# Patient Record
Sex: Female | Born: 1960 | Race: Black or African American | Hispanic: No | Marital: Single | Smoking: Never smoker
Health system: Southern US, Community
[De-identification: ages and names within clinical notes are randomized; demographics above are authoritative.]

## PROBLEM LIST (undated history)

## (undated) DIAGNOSIS — E669 Obesity, unspecified: Secondary | ICD-10-CM

## (undated) DIAGNOSIS — I10 Essential (primary) hypertension: Secondary | ICD-10-CM

## (undated) DIAGNOSIS — K219 Gastro-esophageal reflux disease without esophagitis: Secondary | ICD-10-CM

## (undated) DIAGNOSIS — D219 Benign neoplasm of connective and other soft tissue, unspecified: Secondary | ICD-10-CM

## (undated) DIAGNOSIS — N939 Abnormal uterine and vaginal bleeding, unspecified: Secondary | ICD-10-CM

## (undated) HISTORY — DX: Abnormal uterine and vaginal bleeding, unspecified: N93.9

## (undated) HISTORY — DX: Gastro-esophageal reflux disease without esophagitis: K21.9

## (undated) HISTORY — DX: Benign neoplasm of connective and other soft tissue, unspecified: D21.9

## (undated) HISTORY — DX: Essential (primary) hypertension: I10

## (undated) HISTORY — DX: Obesity, unspecified: E66.9

---

## 1992-05-16 HISTORY — PX: TUBAL LIGATION: SHX77

## 1996-05-16 HISTORY — PX: BREAST BIOPSY: SHX20

## 1997-10-17 ENCOUNTER — Ambulatory Visit (HOSPITAL_COMMUNITY): Admission: RE | Admit: 1997-10-17 | Discharge: 1997-10-17 | Payer: Self-pay | Admitting: Obstetrics & Gynecology

## 1998-06-03 ENCOUNTER — Ambulatory Visit (HOSPITAL_COMMUNITY): Admission: RE | Admit: 1998-06-03 | Discharge: 1998-06-03 | Payer: Self-pay

## 1999-12-09 ENCOUNTER — Encounter: Admission: RE | Admit: 1999-12-09 | Discharge: 1999-12-09 | Payer: Self-pay | Admitting: Obstetrics

## 1999-12-24 ENCOUNTER — Ambulatory Visit (HOSPITAL_COMMUNITY): Admission: RE | Admit: 1999-12-24 | Discharge: 1999-12-24 | Payer: Self-pay

## 2000-06-16 ENCOUNTER — Other Ambulatory Visit: Admission: RE | Admit: 2000-06-16 | Discharge: 2000-06-16 | Payer: Self-pay | Admitting: Gynecology

## 2001-07-04 ENCOUNTER — Other Ambulatory Visit: Admission: RE | Admit: 2001-07-04 | Discharge: 2001-07-04 | Payer: Self-pay | Admitting: Gynecology

## 2001-11-22 ENCOUNTER — Encounter: Payer: Self-pay | Admitting: Gynecology

## 2001-11-22 ENCOUNTER — Ambulatory Visit (HOSPITAL_COMMUNITY): Admission: RE | Admit: 2001-11-22 | Discharge: 2001-11-22 | Payer: Self-pay | Admitting: Gynecology

## 2001-12-11 ENCOUNTER — Encounter: Payer: Self-pay | Admitting: Internal Medicine

## 2001-12-11 ENCOUNTER — Ambulatory Visit (HOSPITAL_COMMUNITY): Admission: RE | Admit: 2001-12-11 | Discharge: 2001-12-11 | Payer: Self-pay | Admitting: Internal Medicine

## 2002-04-03 ENCOUNTER — Encounter: Admission: RE | Admit: 2002-04-03 | Discharge: 2002-04-03 | Payer: Self-pay | Admitting: Internal Medicine

## 2002-04-03 ENCOUNTER — Encounter: Payer: Self-pay | Admitting: Internal Medicine

## 2002-07-31 ENCOUNTER — Other Ambulatory Visit: Admission: RE | Admit: 2002-07-31 | Discharge: 2002-07-31 | Payer: Self-pay | Admitting: Gynecology

## 2002-09-05 ENCOUNTER — Encounter: Admission: RE | Admit: 2002-09-05 | Discharge: 2002-09-05 | Payer: Self-pay | Admitting: Internal Medicine

## 2002-09-05 ENCOUNTER — Encounter: Payer: Self-pay | Admitting: Internal Medicine

## 2002-11-27 ENCOUNTER — Encounter: Payer: Self-pay | Admitting: Gynecology

## 2002-11-27 ENCOUNTER — Ambulatory Visit (HOSPITAL_COMMUNITY): Admission: RE | Admit: 2002-11-27 | Discharge: 2002-11-27 | Payer: Self-pay | Admitting: Gynecology

## 2003-08-21 ENCOUNTER — Other Ambulatory Visit: Admission: RE | Admit: 2003-08-21 | Discharge: 2003-08-21 | Payer: Self-pay | Admitting: Gynecology

## 2003-11-28 ENCOUNTER — Ambulatory Visit (HOSPITAL_COMMUNITY): Admission: RE | Admit: 2003-11-28 | Discharge: 2003-11-28 | Payer: Self-pay | Admitting: Gynecology

## 2004-08-26 ENCOUNTER — Other Ambulatory Visit: Admission: RE | Admit: 2004-08-26 | Discharge: 2004-08-26 | Payer: Self-pay | Admitting: Gynecology

## 2004-11-29 ENCOUNTER — Ambulatory Visit (HOSPITAL_COMMUNITY): Admission: RE | Admit: 2004-11-29 | Discharge: 2004-11-29 | Payer: Self-pay | Admitting: Gynecology

## 2005-09-05 ENCOUNTER — Other Ambulatory Visit: Admission: RE | Admit: 2005-09-05 | Discharge: 2005-09-05 | Payer: Self-pay | Admitting: Gynecology

## 2005-11-07 ENCOUNTER — Inpatient Hospital Stay (HOSPITAL_COMMUNITY): Admission: RE | Admit: 2005-11-07 | Discharge: 2005-11-09 | Payer: Self-pay | Admitting: Gynecology

## 2005-11-07 ENCOUNTER — Encounter (INDEPENDENT_AMBULATORY_CARE_PROVIDER_SITE_OTHER): Payer: Self-pay | Admitting: Specialist

## 2005-11-07 HISTORY — PX: ABDOMINAL HYSTERECTOMY: SHX81

## 2005-12-01 ENCOUNTER — Ambulatory Visit (HOSPITAL_COMMUNITY): Admission: RE | Admit: 2005-12-01 | Discharge: 2005-12-01 | Payer: Self-pay | Admitting: Gynecology

## 2006-10-03 ENCOUNTER — Other Ambulatory Visit: Admission: RE | Admit: 2006-10-03 | Discharge: 2006-10-03 | Payer: Self-pay | Admitting: Gynecology

## 2006-12-19 ENCOUNTER — Ambulatory Visit (HOSPITAL_COMMUNITY): Admission: RE | Admit: 2006-12-19 | Discharge: 2006-12-19 | Payer: Self-pay | Admitting: Gynecology

## 2007-10-04 ENCOUNTER — Other Ambulatory Visit: Admission: RE | Admit: 2007-10-04 | Discharge: 2007-10-04 | Payer: Self-pay | Admitting: Gynecology

## 2007-12-20 ENCOUNTER — Ambulatory Visit (HOSPITAL_COMMUNITY): Admission: RE | Admit: 2007-12-20 | Discharge: 2007-12-20 | Payer: Self-pay | Admitting: Gynecology

## 2008-10-07 ENCOUNTER — Ambulatory Visit: Payer: Self-pay | Admitting: Gynecology

## 2008-10-07 ENCOUNTER — Other Ambulatory Visit: Admission: RE | Admit: 2008-10-07 | Discharge: 2008-10-07 | Payer: Self-pay | Admitting: Gynecology

## 2008-10-07 ENCOUNTER — Encounter: Payer: Self-pay | Admitting: Gynecology

## 2008-12-23 ENCOUNTER — Ambulatory Visit (HOSPITAL_COMMUNITY): Admission: RE | Admit: 2008-12-23 | Discharge: 2008-12-23 | Payer: Self-pay | Admitting: Gynecology

## 2009-10-08 ENCOUNTER — Ambulatory Visit: Payer: Self-pay | Admitting: Gynecology

## 2009-10-08 ENCOUNTER — Other Ambulatory Visit: Admission: RE | Admit: 2009-10-08 | Discharge: 2009-10-08 | Payer: Self-pay | Admitting: Gynecology

## 2009-11-30 ENCOUNTER — Ambulatory Visit: Payer: Self-pay | Admitting: Gynecology

## 2009-12-24 ENCOUNTER — Ambulatory Visit (HOSPITAL_COMMUNITY): Admission: RE | Admit: 2009-12-24 | Discharge: 2009-12-24 | Payer: Self-pay | Admitting: Gynecology

## 2010-10-01 NOTE — Discharge Summary (Signed)
NAME:  Faith Delgado, Faith Delgado                 ACCOUNT NO.:  [id number]   MEDICAL RECORD NO.:  [id number]          PATIENT TYPE:  INP   LOCATION:  9316                          FACILITY:  [facility]   PHYSICIAN:  Juan H. Lily Peer, M.D.DATE OF BIRTH:  1961-02-06   DATE OF ADMISSION:  11/07/2005  DATE OF DISCHARGE:  11/09/2005                                 DISCHARGE SUMMARY   TOTAL DAYS HOSPITALIZED:  Two.   HISTORY:  The patient is a 50 year old gravida 2, para 2 with symptomatic  leiomyomatous uteri who underwent total abdominal hysterectomy the morning  of November 07, 2005.  She had blood loss of 300 mL and had received cefoxitin 2  g IV for prophylaxis.  She did well postoperatively.  Her Foley catheter was  removed within 24 hours.  She was up and ambulating, tolerated liquid diet  and her diet was advanced to a regular diet.  Her postoperative day #1  hemoglobin was 9.9.  Urine output had been adequate.  On her postoperative  day, patient had been up and ambulating, tolerating a regular diet well and  passed flatus, had showered and was ready to be discharged home.  Her vital  signs were stable and she was afebrile.   FINAL DIAGNOSIS:  Symptomatic leiomyomatous uteri.   PROCEDURE PERFORMED:  Total abdominal hysterectomy.   FINAL DISPOSITION AND FOLLOW-UP:  Patient was up and ambulating, tolerated  regular diet well and had showered and was ready to be discharged home on  her second postoperative day.  She will returned back to the office in a few  days to have her staples removed.  Discharge instructions were provided.  She was given a prescription of Darvocet to take one p.o. q.4-6h. p.r.n.  pain and Motrin 800 mg t.i.d. p.r.n.  Pathology report pending at the time  of this dictation.      Juan H. Lily Peer, M.D.  Electronically Signed     JHF/MEDQ  D:  11/09/2005  T:  11/09/2005  Job:  [id number]

## 2010-10-01 NOTE — H&P (Signed)
NAME:  Delgado, Faith              ACCOUNT NO.:  [id number]   MEDICAL RECORD NO.:  [id number]          PATIENT TYPE:  INP   LOCATION:                                [facility]ILITY[facility]  [facility]   PHYSICIAN:  Juan H. Lily Peer, M.D.     DATE OF BIRTH:   DATE OF ADMISSION:  11/07/2005  DATE OF DISCHARGE:                                HISTORY & PHYSICAL   CHIEF COMPLAINT:  Symptomatic leiomyomatous uteri.   HISTORY:  The patient is a 50 year old gravida 2, para 2, who was seen for  annual gynecological examination on April 23rd and during her examination  her uterus felt to be enlarged.  She subsequently underwent an ultrasound  which demonstrated the following.  The uterus measured 10 x 6 x 6 cm, had  intramural myomas and a subserosal myoma measuring 44 x 28 mm and a right  pedunculated myoma measuring 70 x 52 x 56 mm.  The right ovary had small  dominant follicles but the left adnexa was not able to be visualized.  The  patient had been complaining of dysmenorrhea, menorrhagia and dyspareunia.  She had a Pap smear done at time of her annual exam on April 23rd which was  normal.  She also had an endometrial biopsy done on June 8th which  demonstrated proliferative endometrium with no evidence of hyperplasia or  malignancy identified.  The patient's most recent hemoglobin and hematocrit  demonstrated a hemoglobin of 11.5, hematocrit 35.6, platelet count 257,000.  The patient is scheduled for total abdominal hysterectomy with ovarian  conservation.   PAST MEDICAL HISTORY:  1.  Patient DENIES ANY ALLERGY.  2.  History of fibroids.  3.  Menorrhagia.   She is on multivitamins and has had a previous tubal sterilization procedure  in 1994.   FAMILY HISTORY:  Significant for hypertension and colon cancer in  grandparents.   PHYSICAL EXAMINATION:  Patient weighs 202 pounds, height 5 feet 5 inches  tall, blood pressure 130/80.  HEENT:  Unremarkable.  NECK:  Supple.  Trachea midline.  No carotid  bruits, no thyromegaly.  LUNGS:  Clear to auscultation without any rhonchi or wheezes.  HEART:  Regular rate and rhythm, no murmurs or gallop.  BREAST EXAM:  Was unremarkable.  ABDOMEN:  Soft and nontender.  No rebound or guarding.  PELVIC EXAM:  Bartholin and urethra __________ within normal limits.  Vagina  and cervix no lesions or discharge.  Uterus irregular shape, enlarged,  especially deviated to the right.  Adnexa with a fullness in that area,  suspicious for the fibroid seen on ultrasound.  RECTAL EXAM:  Unremarkable.   ASSESSMENT:  A 50 year old gravida 2, para 2, with previous tubal  sterilization procedure with symptomatic leiomyomata uteri consisting of  iron deficiency anemia, dysmenorrhea, menorrhagia, dyspareunia.  Normal  endometrial biopsy, normal Pap smear, normal mammogram done recently.  The  patient's endometrial biopsy was benign as well.  The patient scheduled to  undergo a total abdominal hysterectomy at [facility] on Monday, June  25th, at 7:30 a.m.  The risks, benefits and pros and cons of the  operation  were discussed in detail with the patient to include infection.  She will  receive prophylactic antibiotics.  The risks were deviated thrombosis, she  will have PSA stockings to prevent such event and also in the event of  uncontrolled hemorrhage if patient were to need blood or blood products.  She is fully aware of potential risk associated with blood transfusion such  as anaphylactic reaction, hepatitis and AIDS.  Also, potential risk for  trauma to internal organ requiring corrective surgery at that time as well.  All questions were answered and will follow accordingly.   PLAN:  The patient is scheduled for total abdominal hysterectomy on Monday,  June 25th, at 7:30 a.m. at [facility].  Please have History and  Physical available.      Juan H. Lily Peer, M.D.  Electronically Signed     JHF/MEDQ  D:  11/04/2005  T:  11/04/2005  Job:   [id number]

## 2010-10-01 NOTE — Op Note (Signed)
NAME:  Faith Delgado, Faith Delgado                 ACCOUNT NO.:  [id number]   MEDICAL RECORD NO.:  [id number]          PATIENT TYPE:  INP   LOCATION:  9399                          FACILITY:  [facility]   PHYSICIAN:  Juan H. Lily Peer, M.D.DATE OF BIRTH:  1961/04/07   DATE OF PROCEDURE:  11/07/2005  DATE OF DISCHARGE:                                 OPERATIVE REPORT   INDICATIONS FOR OPERATION:  A 50 year old gravida 2, para 2 with symptomatic  leiomyomata uteri.   PREOPERATIVE DIAGNOSIS:  Leiomyomata uteri.   POSTOPERATIVE DIAGNOSIS:  Leiomyomata uteri.   PROCEDURE PERFORMED:  Total abdominal hysterectomy.   SURGEON:  Juan H. Lily Peer, M.D.   FIRST ASSISTANT:  Colin Broach, MD   FINDINGS:  The patient had a normal sized uterus, normal tubes and ovaries,  evidence of previous tubal sterilization procedure was evident.  The patient  had an pedunculated leiomyoma coming off the right fundal region measuring  approximately 6 to 7 cm in diameter.  Otherwise remainder of the pelvic  cavity was unremarkable.   DESCRIPTION OF OPERATION:  The patient was taken to the operating room where  she underwent a successful general endotracheal anesthesia.  She had  received 2 grams of cefoxitin for prophylaxis and also had PSA stockings for  DVT prophylaxis.  After the abdomen was prepped and draped in usual sterile  fashion, a Foley catheter was placed.  A Pfannenstiel skin incision was made  2 cm above the symphysis pubis, incision was carried down through skin,  subcutaneous tissue down to the rectus fascia whereby midline nick was made.  The fascia was incised in transverse fashion.  The midline raphe was  entered.  The peritoneal cavity was entered cautiously.  The Lenox Ahr self retaining retractors were placed and the patient was placed  in Trendelenburg position.  After thorough inspection of the pelvic cavity  with the above-mentioned description, two Heaney clamps were placed on each  side of the uterus for traction.  The right round ligament was identified  was suture ligated with 0 Vicryl suture and then transected.  The bladder  flap was established and with the surgeon's finger, the posterior broad vein  was penetrated and a Heaney clamp was used to secure the utero-ovarian  ligament and infundibulopelvic ligament close to the uterus in effort to  leave the tube and ovary behind.  This was transected and the pedicle was  free tied with 0 Vicryl suture followed by transfixion stitch of 0 Vicryl  suture.  The remainder of the parametrium consisting of the broad ligament,  cardinal ligament was serially clamped, cut and suture-ligated with 0 Vicryl  suture to the level of the right vaginal fornix.  A similar procedure was  carried out on the contralateral side with the use of Jorgensen's scissors,  the cervix was excised from the vagina and cervix and uterus was passed off  the operative field for histological evaluation.  The uterus was closed in  the following fashion.  Both angles were secured with a transfixion stitch  of 0 Vicryl suture and the remaining cuff was  closed with interrupted  sutures of 0 Vicryl suture.  Bilateral oophoropexy was accomplished by  securing the ovary to the respective round ligament with 0 Vicryl suture.  The pelvic cavity was then copiously irrigated with normal saline solution.  After ascertaining adequate hemostasis, the sponges were removed and  retractors were removed.  Sponge and needle count were correct.  The  visceral peritoneum was not closed.  The rectus fascia was closed with a  running stitch of 0 Vicryl suture.  For some of the right oozing that was  near the muscle before closure, a strip of Surgicel was placed.  The rectus  fascia was then closed with 0 Vicryl suture in a running stitch fashion  after ascertaining hemostasis, subcutaneous bleeders were Bovie cauterized  and the skin was reapproximated with skin clips  followed by placing Xeroform  gauze.  The patient was awakened, transferred to recovery  room with stable  vital signs.  Blood loss from procedure was 300 mL.  IV fluids consisted of  1300 mL of lactated Ringer's.  Urine output  300 mL.      Juan H. Lily Peer, M.D.  Electronically Signed     JHF/MEDQ  D:  11/07/2005  T:  11/07/2005  Job:  [id number]

## 2010-10-26 ENCOUNTER — Ambulatory Visit: Payer: Self-pay | Admitting: Women's Health

## 2010-10-28 ENCOUNTER — Other Ambulatory Visit (HOSPITAL_COMMUNITY)
Admission: RE | Admit: 2010-10-28 | Discharge: 2010-10-28 | Disposition: A | Discharge status: Home / Self Care | Payer: BC Managed Care – PPO | Source: Ambulatory Visit | Attending: Gynecology | Admitting: Gynecology

## 2010-10-28 ENCOUNTER — Encounter (INDEPENDENT_AMBULATORY_CARE_PROVIDER_SITE_OTHER): Payer: BC Managed Care – PPO | Admitting: Gynecology

## 2010-10-28 ENCOUNTER — Other Ambulatory Visit: Payer: BC Managed Care – PPO

## 2010-10-28 ENCOUNTER — Other Ambulatory Visit: Payer: Self-pay | Admitting: Gynecology

## 2010-10-28 ENCOUNTER — Ambulatory Visit (INDEPENDENT_AMBULATORY_CARE_PROVIDER_SITE_OTHER): Payer: BC Managed Care – PPO | Admitting: Gynecology

## 2010-10-28 DIAGNOSIS — N949 Unspecified condition associated with female genital organs and menstrual cycle: Secondary | ICD-10-CM

## 2010-10-28 DIAGNOSIS — N831 Corpus luteum cyst of ovary, unspecified side: Secondary | ICD-10-CM

## 2010-10-28 DIAGNOSIS — Z01419 Encounter for gynecological examination (general) (routine) without abnormal findings: Secondary | ICD-10-CM

## 2010-10-28 DIAGNOSIS — R1031 Right lower quadrant pain: Secondary | ICD-10-CM

## 2010-10-28 DIAGNOSIS — N8300 Follicular cyst of ovary, unspecified side: Secondary | ICD-10-CM

## 2010-10-28 DIAGNOSIS — Z124 Encounter for screening for malignant neoplasm of cervix: Secondary | ICD-10-CM | POA: Insufficient documentation

## 2010-11-16 ENCOUNTER — Other Ambulatory Visit: Payer: Self-pay | Admitting: Gynecology

## 2010-11-16 DIAGNOSIS — Z1231 Encounter for screening mammogram for malignant neoplasm of breast: Secondary | ICD-10-CM

## 2010-12-28 ENCOUNTER — Ambulatory Visit (HOSPITAL_COMMUNITY)
Admission: RE | Admit: 2010-12-28 | Discharge: 2010-12-28 | Disposition: A | Discharge status: Home / Self Care | Payer: BC Managed Care – PPO | Source: Ambulatory Visit | Attending: Gynecology | Admitting: Gynecology

## 2010-12-28 DIAGNOSIS — Z1231 Encounter for screening mammogram for malignant neoplasm of breast: Secondary | ICD-10-CM | POA: Insufficient documentation

## 2011-05-08 ENCOUNTER — Encounter (HOSPITAL_COMMUNITY): Payer: Self-pay

## 2011-05-08 ENCOUNTER — Emergency Department (HOSPITAL_COMMUNITY)
Admission: EM | Admit: 2011-05-08 | Discharge: 2011-05-08 | Disposition: A | Discharge status: Home / Self Care | Payer: No Typology Code available for payment source | Attending: Emergency Medicine | Admitting: Emergency Medicine

## 2011-05-08 ENCOUNTER — Emergency Department (HOSPITAL_COMMUNITY): Payer: No Typology Code available for payment source

## 2011-05-08 DIAGNOSIS — I10 Essential (primary) hypertension: Secondary | ICD-10-CM | POA: Insufficient documentation

## 2011-05-08 DIAGNOSIS — S39012A Strain of muscle, fascia and tendon of lower back, initial encounter: Secondary | ICD-10-CM

## 2011-05-08 DIAGNOSIS — Y92410 Unspecified street and highway as the place of occurrence of the external cause: Secondary | ICD-10-CM | POA: Insufficient documentation

## 2011-05-08 DIAGNOSIS — S335XXA Sprain of ligaments of lumbar spine, initial encounter: Secondary | ICD-10-CM | POA: Insufficient documentation

## 2011-05-08 MED ORDER — DIAZEPAM 5 MG PO TABS: 5.0000 mg | ORAL_TABLET | Freq: Three times a day (TID) | ORAL | Status: AC | PRN

## 2011-05-08 MED ORDER — HYDROCODONE-ACETAMINOPHEN 5-500 MG PO TABS: 1.0000 | ORAL_TABLET | Freq: Four times a day (QID) | ORAL | Status: AC | PRN

## 2011-05-08 MED ORDER — IBUPROFEN 600 MG PO TABS: 600.0000 mg | ORAL_TABLET | Freq: Four times a day (QID) | ORAL | Status: AC | PRN
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
## 2011-05-09 NOTE — ED Provider Notes (Signed)
Medical screening examination/treatment/procedure(s) were performed by non-physician practitioner and as supervising physician I was immediately available for consultation/collaboration.  Kalep Full, MD 05/09/11 0040 

## 2011-11-22 ENCOUNTER — Other Ambulatory Visit: Payer: Self-pay | Admitting: Gynecology

## 2011-11-22 ENCOUNTER — Ambulatory Visit (INDEPENDENT_AMBULATORY_CARE_PROVIDER_SITE_OTHER): Payer: BC Managed Care – PPO | Admitting: Gynecology

## 2011-11-22 ENCOUNTER — Encounter: Payer: Self-pay | Admitting: Gynecology

## 2011-11-22 VITALS — BP 130/80 | Ht 65.0 in | Wt 196.0 lb

## 2011-11-22 DIAGNOSIS — M549 Dorsalgia, unspecified: Secondary | ICD-10-CM

## 2011-11-22 DIAGNOSIS — Z1231 Encounter for screening mammogram for malignant neoplasm of breast: Secondary | ICD-10-CM

## 2011-11-22 DIAGNOSIS — I10 Essential (primary) hypertension: Secondary | ICD-10-CM | POA: Insufficient documentation

## 2011-11-22 DIAGNOSIS — Z01419 Encounter for gynecological examination (general) (routine) without abnormal findings: Secondary | ICD-10-CM

## 2011-11-22 DIAGNOSIS — K219 Gastro-esophageal reflux disease without esophagitis: Secondary | ICD-10-CM | POA: Insufficient documentation

## 2011-11-22 LAB — URINALYSIS W MICROSCOPIC + REFLEX CULTURE
Glucose, UA: NEGATIVE mg/dL
Ketones, ur: NEGATIVE mg/dL
Leukocytes, UA: NEGATIVE
Specific Gravity, Urine: 1.025 (ref 1.005–1.030)

## 2011-11-22 NOTE — Progress Notes (Signed)
Faith Delgado [birth year] [id number]   History:    51 y.o.  for annual gyn exam with the only complaint of a slight low back discomfort for which she been in the emergency room in December 2012. She attributes to straining and lifting at work. She denied any radiculopathy. Review of her record indicated that her last Pap smear and mammogram was in 2012 both were normal. Patient would know prior history of cervical dysplasia. 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 Patient frequently does her self breast examination. Review of her record indicated she was weighing 190 self 196 now. She is being followed by Dr. Ricki Miller for her hypertension and schedule seen in the next few weeks for whereby labs will be drawn then. Patient would know prior history of colonoscopy in the past. Patient's mother died what appears to be uterine cancer and not ovarian cancer and received no radiation or chemotherapy.  Past medical history,surgical history, family history and social history were all reviewed and documented in the EPIC chart.  Gynecologic History No LMP recorded. Patient has had a hysterectomy. Contraception: Hysterectomy Last Pap: 2012. Results were: normal Last mammogram: 2012. Results were: normal  Obstetric History OB History    Grav Para Term Preterm Abortions TAB SAB Ect Mult Living   2 2 2       2      # Outc Date GA Lbr Len/2nd Wgt Sex Del Anes PTL Lv   1 TRM     M SVD  No Yes   2 TRM     M SVD  No Yes       ROS: A ROS was performed and pertinent positives and negatives are included in the history.  GENERAL: No fevers or chills. HEENT: No change in vision, no earache, sore throat or sinus congestion. NECK: No pain or stiffness. CARDIOVASCULAR: No chest pain or pressure. No palpitations. PULMONARY: No shortness of breath, cough or wheeze. GASTROINTESTINAL: No abdominal pain, nausea, vomiting or diarrhea, melena or bright red blood per  rectum. GENITOURINARY: No urinary frequency, urgency, hesitancy or dysuria. MUSCULOSKELETAL: No joint or muscle pain, low back pain lumbosacral region, no recent trauma. DERMATOLOGIC: No rash, no itching, no lesions. ENDOCRINE: No polyuria, polydipsia, no heat or cold intolerance. No recent change in weight. HEMATOLOGICAL: No anemia or easy bruising or bleeding. NEUROLOGIC: No headache, seizures, numbness, tingling or weakness. PSYCHIATRIC: No depression, no loss of interest in normal activity or change in sleep pattern.     Exam: chaperone present  BP 130/80  Ht 5\' 5"  (1.651 m)  Wt 196 lb (88.905 kg)  BMI 32.62 kg/m2  Body mass index is 32.62 kg/(m^2).  General appearance : Well developed well nourished female. No acute distress HEENT: Neck supple, trachea midline, no carotid bruits, no thyroidmegaly Lungs: Clear to auscultation, no rhonchi or wheezes, or rib retractions  Heart: Regular rate and rhythm, no murmurs or gallops Breast:Examined in sitting and supine position were symmetrical in appearance, no palpable masses or tenderness,  no skin retraction, no nipple inversion, no nipple discharge, no skin discoloration, no axillary or supraclavicular lymphadenopathy Abdomen: no palpable masses or tenderness, no rebound or guarding Extremities: no edema or skin discoloration or tenderness  Pelvic:  Bartholin, Urethra, Skene Glands: Within normal limits             Vagina: No gross lesions or discharge  Cervix: Absent Uterus absent  Adnexa  Without masses or tenderness  Anus and  perineum  normal   Rectovaginal  normal sphincter tone without palpated masses or tenderness             Hemoccult cards provided     Assessment/Plan:  51 y.o. female for annual exam who was given a requisition to schedule her mammogram which is due next month. I've given her the name also of the gastroenterologist here in our community for her to schedule a screening colonoscopy. Her urinalysis today was  negative. New Pap smear screening guidelines discussed and she will no longer need them. She's never had any abnormal Pap smears in the past. Hemoccult cards to provided for the patient to submit to the office for testing. She is encouraged to continue her calcium and vitamin D as well as regular exercise for osteoporosis prevention.    Ok Edwards MD, 9:07 AM 11/22/2011

## 2011-11-22 NOTE — Patient Instructions (Signed)

## 2011-12-01 ENCOUNTER — Other Ambulatory Visit: Payer: Self-pay | Admitting: *Deleted

## 2011-12-01 ENCOUNTER — Other Ambulatory Visit: Payer: Self-pay | Admitting: Gynecology

## 2011-12-01 DIAGNOSIS — Z1211 Encounter for screening for malignant neoplasm of colon: Secondary | ICD-10-CM

## 2011-12-29 ENCOUNTER — Ambulatory Visit (HOSPITAL_COMMUNITY)
Admission: RE | Admit: 2011-12-29 | Discharge: 2011-12-29 | Disposition: A | Discharge status: Home / Self Care | Payer: BC Managed Care – PPO | Source: Ambulatory Visit | Attending: Gynecology | Admitting: Gynecology

## 2011-12-29 DIAGNOSIS — Z1231 Encounter for screening mammogram for malignant neoplasm of breast: Secondary | ICD-10-CM

## 2012-01-03 ENCOUNTER — Other Ambulatory Visit: Payer: Self-pay | Admitting: *Deleted

## 2012-01-03 DIAGNOSIS — R928 Other abnormal and inconclusive findings on diagnostic imaging of breast: Secondary | ICD-10-CM

## 2012-01-04 ENCOUNTER — Ambulatory Visit (AMBULATORY_SURGERY_CENTER): Payer: BC Managed Care – PPO | Admitting: *Deleted

## 2012-01-04 ENCOUNTER — Encounter: Payer: Self-pay | Admitting: Internal Medicine

## 2012-01-04 VITALS — Ht 65.0 in | Wt 201.1 lb

## 2012-01-04 DIAGNOSIS — Z1211 Encounter for screening for malignant neoplasm of colon: Secondary | ICD-10-CM

## 2012-01-04 MED ORDER — MOVIPREP 100 G PO SOLR: ORAL | Status: DC

## 2012-01-04 NOTE — Addendum Note (Signed)
Addended by: Aura Camps on: 01/04/2012 10:13 AM   Modules accepted: Orders

## 2012-01-12 ENCOUNTER — Ambulatory Visit
Admission: RE | Admit: 2012-01-12 | Discharge: 2012-01-12 | Disposition: A | Discharge status: Home / Self Care | Payer: BC Managed Care – PPO | Source: Ambulatory Visit | Attending: Gynecology | Admitting: Gynecology

## 2012-01-12 DIAGNOSIS — R928 Other abnormal and inconclusive findings on diagnostic imaging of breast: Secondary | ICD-10-CM

## 2012-01-18 ENCOUNTER — Encounter: Payer: No Typology Code available for payment source | Admitting: Internal Medicine

## 2012-02-08 ENCOUNTER — Ambulatory Visit (AMBULATORY_SURGERY_CENTER): Payer: BC Managed Care – PPO | Admitting: Internal Medicine

## 2012-02-08 ENCOUNTER — Encounter: Payer: Self-pay | Admitting: Internal Medicine

## 2012-02-08 VITALS — BP 139/76 | HR 61 | Temp 96.9°F | Resp 29 | Ht 65.0 in | Wt 201.0 lb

## 2012-02-08 DIAGNOSIS — Z1211 Encounter for screening for malignant neoplasm of colon: Secondary | ICD-10-CM

## 2012-02-08 MED ORDER — SODIUM CHLORIDE 0.9 % IV SOLN: 500.0000 mL | INTRAVENOUS | Status: DC

## 2012-02-08 NOTE — Progress Notes (Signed)
Patient did not experience any of the following events: a burn prior to discharge; a fall within the facility; wrong site/side/patient/procedure/implant event; or a hospital transfer or hospital admission upon discharge from the facility. (G8907) Patient did not have preoperative order for IV antibiotic SSI prophylaxis. (G8918)  

## 2012-02-08 NOTE — Patient Instructions (Addendum)
YOU HAD AN ENDOSCOPIC PROCEDURE TODAY AT THE [facility]: Refer to the procedure report that was given to you for any specific questions about what was found during the examination.  If the procedure report does not answer your questions, please call your gastroenterologist to clarify.  If you requested that your care partner not be given the details of your procedure findings, then the procedure report has been included in a sealed envelope for you to review at your convenience later.  YOU SHOULD EXPECT: Some feelings of bloating in the abdomen. Passage of more gas than usual.  Walking can help get rid of the air that was put into your GI tract during the procedure and reduce the bloating. If you had a lower endoscopy (such as a colonoscopy or flexible sigmoidoscopy) you may notice spotting of blood in your stool or on the toilet paper. If you underwent a bowel prep for your procedure, then you may not have a normal bowel movement for a few days.  DIET: Your first meal following the procedure should be a light meal and then it is ok to progress to your normal diet.  A half-sandwich or bowl of soup is an example of a good first meal.  Heavy or fried foods are harder to digest and may make you feel nauseous or bloated.  Likewise meals heavy in dairy and vegetables can cause extra gas to form and this can also increase the bloating.  Drink plenty of fluids but you should avoid alcoholic beverages for 24 hours.  ACTIVITY: Your care partner should take you home directly after the procedure.  You should plan to take it easy, moving slowly for the rest of the day.  You can resume normal activity the day after the procedure however you should NOT DRIVE or use heavy machinery for 24 hours (because of the sedation medicines used during the test).    SYMPTOMS TO REPORT IMMEDIATELY: A gastroenterologist can be reached at any hour.  During normal business hours, 8:30 AM to 5:00 PM Monday through Friday,  call [phone].  After hours and on weekends, please call the GI answering service at [phone] who will take a message and have the physician on call contact you.   Following lower endoscopy (colonoscopy or flexible sigmoidoscopy):  Excessive amounts of blood in the stool  Significant tenderness or worsening of abdominal pains  Swelling of the abdomen that is new, acute  Fever of 100F or higher    Black, tarry-looking stools  FOLLOW UP: If any biopsies were taken you will be contacted by phone or by letter within the next 1-3 weeks.  Call your gastroenterologist if you have not heard about the biopsies in 3 weeks.       Information on diverticulosis & high fiber diet given to you today Our staff will call the home number listed on your records the next business day following your procedure to check on you and address any questions or concerns that you may have at that time regarding the information given to you following your procedure. This is a courtesy call and so if there is no answer at the home number and we have not heard from you through the emergency physician on call, we will assume that you have returned to your regular daily activities without incident.  SIGNATURES/CONFIDENTIALITY: You and/or your care partner have signed paperwork which will be entered into your electronic medical record.  These signatures attest to the fact that that  the information above on your After Visit Summary has been reviewed and is understood.  Full responsibility of the confidentiality of this discharge information lies with you and/or your care-partner.    Information on diverticulosis & high fiber diet given to you today

## 2012-02-08 NOTE — Op Note (Signed)
 [facility] [address] [place], [address]   COLONOSCOPY PROCEDURE REPORT  PATIENT: Faith Delgado, Faith Delgado  MR#: [id number] BIRTHDATE: [birth year] , 51  yrs. old GENDER: Female ENDOSCOPIST: Hart Carwin, MD REFERRED BY:  Juline Patch, M.D. PROCEDURE DATE:  02/08/2012 PROCEDURE:   Colonoscopy, screening ASA CLASS:   Class II INDICATIONS:average risk patient for colon cancer. MEDICATIONS: MAC sedation, administered by CRNA and Propofol (Diprivan) 200 mg IV  DESCRIPTION OF PROCEDURE:   After the risks and benefits and of the procedure were explained, informed consent was obtained.  A digital rectal exam revealed no abnormalities of the rectum.    The endoscope was introduced through the anus and advanced to the cecum, which was identified by both the appendix and ileocecal valve .  The quality of the prep was excellent, using MoviPrep . The instrument was then slowly withdrawn as the colon was fully examined.     COLON FINDINGS: A normal appearing cecum, ileocecal valve, and appendiceal orifice were identified.  The ascending, hepatic flexure, transverse, splenic flexure, descending, sigmoid colon and rectum appeared unremarkable.  No polyps or cancers were seen. Retroflexed views revealed no abnormalities.     The scope was then withdrawn from the patient and the procedure completed.there were few scattered shallow diverticuli  COMPLICATIONS: There were no complications. ENDOSCOPIC IMPRESSION: Normal colon  RECOMMENDATIONS: High fiber diet   REPEAT EXAM: In 10 year(s)  for Colonoscopy.  cc:  _______________________________ eSignedHart Carwin, MD 02/08/2012 9:58 AM     PATIENT NAME:  Faith Delgado, Faith Delgado MR#: [id number]

## 2012-02-08 NOTE — Progress Notes (Signed)
The pt tolerated the colonoscopy very well. Maw   

## 2012-02-09 ENCOUNTER — Telehealth: Payer: Self-pay | Admitting: *Deleted

## 2012-02-09 NOTE — Telephone Encounter (Signed)
  Follow up Call-  Call back number 02/08/2012  Post procedure Call Back phone  # [phone]  Permission to leave phone message No     Patient questions:  Do you have a fever, pain , or abdominal swelling? no Pain Score  0 *  Have you tolerated food without any problems? yes  Have you been able to return to your normal activities? yes  Do you have any questions about your discharge instructions: Diet   no Medications  no Follow up visit  no  Do you have questions or concerns about your Care? no  Actions: * If pain score is 4 or above: No action needed, pain <4.

## 2012-11-29 ENCOUNTER — Encounter: Payer: Self-pay | Admitting: Gynecology

## 2012-11-30 ENCOUNTER — Ambulatory Visit (INDEPENDENT_AMBULATORY_CARE_PROVIDER_SITE_OTHER): Payer: BC Managed Care – PPO | Admitting: Gynecology

## 2012-11-30 ENCOUNTER — Encounter: Payer: Self-pay | Admitting: Gynecology

## 2012-11-30 VITALS — BP 132/88 | Ht 65.0 in | Wt 194.0 lb

## 2012-11-30 DIAGNOSIS — Z8049 Family history of malignant neoplasm of other genital organs: Secondary | ICD-10-CM

## 2012-11-30 DIAGNOSIS — N951 Menopausal and female climacteric states: Secondary | ICD-10-CM

## 2012-11-30 DIAGNOSIS — Z01419 Encounter for gynecological examination (general) (routine) without abnormal findings: Secondary | ICD-10-CM

## 2012-11-30 DIAGNOSIS — Z8639 Personal history of other endocrine, nutritional and metabolic disease: Secondary | ICD-10-CM

## 2012-11-30 NOTE — Patient Instructions (Addendum)
Perimenopause Perimenopause is the time when your body begins to move into the menopause (no menstrual period for 12 straight months). It is a natural process. Perimenopause can begin 2 to 8 years before the menopause and usually lasts for one year after the menopause. During this time, your ovaries may or may not produce an egg. The ovaries vary in their production of estrogen and progesterone hormones each month. This can cause irregular menstrual periods, difficulty in getting pregnant, vaginal bleeding between periods and uncomfortable symptoms. CAUSES  Irregular production of the ovarian hormones, estrogen and progesterone, and not ovulating every month.  Other causes include:  Tumor of the pituitary gland in the brain.  Medical disease that affects the ovaries.  Radiation treatment.  Chemotherapy.  Unknown causes. Heavy smoking and excessive alcohol intake can bring on perimenopause sooner.  Hormone Therapy At menopause, your body begins making less estrogen and progesterone hormones. This causes the body to stop having menstrual periods. This is because estrogen and progesterone hormones control your periods and menstrual cycle. A lack of estrogen may cause symptoms such as: Hot flushes (or hot flashes). Vaginal dryness. Dry skin. Loss of sex drive. Risk of bone loss (osteoporosis). When this happens, you may choose to take hormone therapy to get back the estrogen lost during menopause. When the hormone estrogen is given alone, it is usually referred to as ET (Estrogen Therapy). When the hormone progestin is combined with estrogen, it is generally called HT (Hormone Therapy). This was formerly known as hormone replacement therapy (HRT). Your caregiver can help you make a decision on what will be best for you. The decision to use HT seems to change often as new studies are done. Many studies do not agree on the benefits of hormone replacement therapy. LIKELY BENEFITS OF HT INCLUDE  PROTECTION FROM: Hot Flushes (also called hot flashes) - A hot flush is a sudden feeling of heat that spreads over the face and body. The skin may redden like a blush. It is connected with sweats and sleep disturbance. Women going through menopause may have hot flushes a few times a month or several times per day depending on the woman. Osteoporosis (bone loss)- Estrogen helps guard against bone loss. After menopause, a woman's bones slowly lose calcium and become weak and brittle. As a result, bones are more likely to break. The hip, wrist, and spine are affected most often. Hormone therapy can help slow bone loss after menopause. Weight bearing exercise and taking calcium with vitamin D also can help prevent bone loss. There are also medications that your caregiver can prescribe that can help prevent osteoporosis. Vaginal Dryness - Loss of estrogen causes changes in the vagina. Its lining may become thin and dry. These changes can cause pain and bleeding during sexual intercourse. Dryness can also lead to infections. This can cause burning and itching. (Vaginal estrogen treatment can help relieve pain, itching, and dryness.) Urinary Tract Infections are more common after menopause because of lack of estrogen. Some women also develop urinary incontinence because of low estrogen levels in the vagina and bladder. Possible other benefits of estrogen include a positive effect on mood and short-term memory in women. RISKS AND COMPLICATIONS Using estrogen alone without progesterone causes the lining of the uterus to grow. This increases the risk of lining of the uterus (endometrial) cancer. Your caregiver should give another hormone called progestin if you have a uterus. Women who take combined (estrogen and progestin) HT appear to have an increased risk of  breast cancer. The risk appears to be small, but increases throughout the time that HT is taken. Combined therapy also makes the breast tissue slightly  denser which makes it harder to read mammograms (breast X-rays). Combined, estrogen and progesterone therapy can be taken together every day, in which case there may be spotting of blood. HT therapy can be taken cyclically in which case you will have menstrual periods. Cyclically means HT is taken for a set amount of days, then not taken, then this process is repeated. HT may increase the risk of stroke, heart attack, breast cancer and forming blood clots in your leg. Transdermal estrogen (estrogen that is absorbed through the skin with a patch or a cream) may have more positive results with: Cholesterol. Blood pressure. Blood clots. Having the following conditions may indicate you should not have HT: Endometrial cancer. Liver disease. Breast cancer. Heart disease. History of blood clots. Stroke. TREATMENT  If you choose to take HT and have a uterus, usually estrogen and progestin are prescribed. Your caregiver will help you decide the best way to take the medications. Possible ways to take estrogen include: Pills. Patches. Gels. Sprays. Vaginal estrogen cream, rings and tablets. It is best to take the lowest dose possible that will help your symptoms and take them for the shortest period of time that you can. Hormone therapy can help relieve some of the problems (symptoms) that affect women at menopause. Before making a decision about HT, talk to your caregiver about what is best for you. Be well informed and comfortable with your decisions. HOME CARE INSTRUCTIONS  Follow your caregivers advice when taking the medications. A Pap test is done to screen for cervical cancer. The first Pap test should be done at age 28. Between ages 59 and 55, Pap tests are repeated every 2 years. Beginning at age 62, you are advised to have a Pap test every 3 years as long as your past 3 Pap tests have been normal. Some women have medical problems that increase the chance of getting cervical cancer. Talk  to your caregiver about these problems. It is especially important to talk to your caregiver if a new problem develops soon after your last Pap test. In these cases, your caregiver may recommend more frequent screening and Pap tests. The above recommendations are the same for women who have or have not gotten the vaccine for HPV (Human Papillomavirus). If you had a hysterectomy for a problem that was not a cancer or a condition that could lead to cancer, then you no longer need Pap tests. However, even if you no longer need a Pap test, a regular exam is a good idea to make sure no other problems are starting.  If you are between ages 25 and 42, and you have had normal Pap tests going back 10 years, you no longer need Pap tests. However, even if you no longer need a Pap test, a regular exam is a good idea to make sure no other problems are starting.  If you have had past treatment for cervical cancer or a condition that could lead to cancer, you need Pap tests and screening for cancer for at least 20 years after your treatment. If Pap tests have been discontinued, risk factors (such as a new sexual partner) need to be re-assessed to determine if screening should be resumed. Some women may need screenings more often if they are at high risk for cervical cancer. Get mammograms done as per the advice of  your caregiver. SEEK IMMEDIATE MEDICAL CARE IF: You develop abnormal vaginal bleeding. You have pain or swelling in your legs, shortness of breath, or chest pain. You develop dizziness or headaches. You have lumps or changes in your breasts or armpits. You have slurred speech. You develop weakness or numbness of your arms or legs. You have pain, burning, or bleeding when urinating. You develop abdominal pain. Document Released: 01/29/2003 Document Revised: 07/25/2011 Document Reviewed: 05/19/2010 [employer] Patient Information 2014 [employer], [place].   SYMPTOMS   Hot flashes.  Night  sweats.  Irregular menstrual periods.  Decrease sex drive.  Vaginal dryness.  Headaches.  Mood swings.  Depression.  Memory problems.  Irritability.  Tiredness.  Weight gain.  Trouble getting pregnant.  The beginning of losing bone cells (osteoporosis).  The beginning of hardening of the arteries (atherosclerosis). DIAGNOSIS  Your caregiver will make a diagnosis by analyzing your age, menstrual history and your symptoms. They will do a physical exam noting any changes in your body, especially your female organs. Female hormone tests may or may not be helpful depending on the amount and when you produce the female hormones. However, other hormone tests may be helpful (ex. thyroid hormone) to rule out other problems. TREATMENT  The decision to treat during the perimenopause should be made by you and your caregiver depending on how the symptoms are affecting you and your life style. There are various treatments available such as:  Treating individual symptoms with a specific medication for that symptom (ex. tranquilizer for depression).  Herbal medications that can help specific symptoms.  Counseling.  Group therapy.  No treatment. HOME CARE INSTRUCTIONS   Before seeing your caregiver, make a list of your menstrual periods (when the occur, how heavy they are, how long between periods and how long they last), your symptoms and when they started.  Take the medication as recommended by your caregiver.  Sleep and rest.  Exercise.  Eat a diet that contains calcium (good for your bones) and soy (acts like estrogen hormone).  Do not smoke.  Avoid alcoholic beverages.  Taking vitamin E may help in certain cases.  Take calcium and vitamin D supplements to help prevent bone loss.  Group therapy is sometimes helpful.  Acupuncture may help in some cases. SEEK MEDICAL CARE IF:   You have any of the above and want to know if it is perimenopause.  You want advice and  treatment for any of your symptoms mentioned above.  You need a referral to a specialist (gynecologist, psychiatrist or psychologist). SEEK IMMEDIATE MEDICAL CARE IF:   You have vaginal bleeding.  Your period lasts longer than 8 days.  You periods are recurring sooner than 21 days.  You have bleeding after intercourse.  You have severe depression.  You have pain when you urinate.  You have severe headaches.  You develop vision problems. Document Released: 06/09/2004 Document Revised: 07/25/2011 Document Reviewed: 02/28/2008 [facility] Patient Information 2014 [place], [place].

## 2012-11-30 NOTE — Progress Notes (Signed)
Faith Delgado Jun 19, 1960 [id number]   History:    52 y.o.  for annual gyn exam and complaining of hot flushes. 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 Patient frequently does her self breast examination. Mammogram normal August 2013. Patient with past history vitamin D deficiency was treated and is taking calcium and vitamin D supplementation. Her primary physicians Dr. Ricki Miller who has been doing her lab work and monitoring her for her hypertension. Patient has not had her Tdap vaccine yet. Dr. Juanda Chance did her colonoscopy in 2013 report to be normal. Patient prior to her hysterectomy has never had abnormal Pap smears.   Past medical history,surgical history, family history and social history were all reviewed and documented in the EPIC chart.  Gynecologic History No LMP recorded. Patient has had a hysterectomy. Contraception: status post hysterectomy Last Pap: 2012. Results were: normal Last mammogram: 2013. Results were: normal  Obstetric History OB History   Grav Para Term Preterm Abortions TAB SAB Ect Mult Living   2 2 2       2      # Outc Date GA Lbr Len/2nd Wgt Sex Del Anes PTL Lv   1 TRM     M SVD  No Yes   2 TRM     M SVD  No Yes       ROS: A ROS was performed and pertinent positives and negatives are included in the history.  GENERAL: No fevers or chills. HEENT: No change in vision, no earache, sore throat or sinus congestion. NECK: No pain or stiffness. CARDIOVASCULAR: No chest pain or pressure. No palpitations. PULMONARY: No shortness of breath, cough or wheeze. GASTROINTESTINAL: No abdominal pain, nausea, vomiting or diarrhea, melena or bright red blood per rectum. GENITOURINARY: No urinary frequency, urgency, hesitancy or dysuria. MUSCULOSKELETAL: No joint or muscle pain, no back pain, no recent trauma. DERMATOLOGIC: No rash, no itching, no lesions. ENDOCRINE: No polyuria, polydipsia, no heat or cold intolerance. No  recent change in weight. HEMATOLOGICAL: No anemia or easy bruising or bleeding. NEUROLOGIC: No headache, seizures, numbness, tingling or weakness. PSYCHIATRIC: No depression, no loss of interest in normal activity or change in sleep pattern.     Exam: chaperone present  BP 132/88  Ht 5\' 5"  (1.651 m)  Wt 194 lb (87.998 kg)  BMI 32.28 kg/m2  Body mass index is 32.28 kg/(m^2).  General appearance : Well developed well nourished female. No acute distress HEENT: Neck supple, trachea midline, no carotid bruits, no thyroidmegaly Lungs: Clear to auscultation, no rhonchi or wheezes, or rib retractions  Heart: Regular rate and rhythm, no murmurs or gallops Breast:Examined in sitting and supine position were symmetrical in appearance, no palpable masses or tenderness,  no skin retraction, no nipple inversion, no nipple discharge, no skin discoloration, no axillary or supraclavicular lymphadenopathy Abdomen: no palpable masses or tenderness, no rebound or guarding Extremities: no edema or skin discoloration or tenderness  Pelvic:  Bartholin, Urethra, Skene Glands: Within normal limits             Vagina: No gross lesions or discharge  Cervix: absent  Uterus Absent Adnexa  Without masses or tenderness  Anus and perineum  normal   Rectovaginal  normal sphincter tone without palpated masses or tenderness             Hemoccult course provided     Assessment/Plan:  52 y.o. female for annual exam with normal exam. Patient complaining of mild vasomotor symptoms  consisting of hot flushes. Patient is not interested ongoing hormonal replacement therapy at the present time. We will check her [facility] today. We discussed the Tdap vaccine and she will receive a today and literature for major was provided as well. She was reminded to schedule her mammogram for next month and to continue to do her monthly self breast examination. We discussed importance of calcium and vitamin D for osteoporosis prevention. She was  reminded to submit to the office the Hemoccult cards for testing. Have reviewed patient's recent lab work done by her primary physician in July of this year which are consistent of a CBC with a hemoglobin 11.4 comprehensive metabolic panel was normal as was liver function tests as well as TSH and vitamin D level.    Ok Edwards MD, 12:04 PM 11/30/2012

## 2012-12-01 LAB — FOLLICLE STIMULATING HORMONE: FSH: 71.8 m[IU]/mL

## 2012-12-04 ENCOUNTER — Encounter: Payer: Self-pay | Admitting: Gynecology

## 2012-12-24 ENCOUNTER — Other Ambulatory Visit: Payer: Self-pay | Admitting: Gynecology

## 2012-12-24 DIAGNOSIS — Z124 Encounter for screening for malignant neoplasm of cervix: Secondary | ICD-10-CM

## 2013-01-04 ENCOUNTER — Ambulatory Visit (HOSPITAL_COMMUNITY)
Admission: RE | Admit: 2013-01-04 | Discharge: 2013-01-04 | Disposition: A | Discharge status: Home / Self Care | Payer: BC Managed Care – PPO | Source: Ambulatory Visit | Attending: Gynecology | Admitting: Gynecology

## 2013-01-04 ENCOUNTER — Other Ambulatory Visit: Payer: Self-pay | Admitting: Gynecology

## 2013-01-04 ENCOUNTER — Ambulatory Visit (HOSPITAL_COMMUNITY): Payer: BC Managed Care – PPO

## 2013-01-04 DIAGNOSIS — Z124 Encounter for screening for malignant neoplasm of cervix: Secondary | ICD-10-CM

## 2013-01-04 DIAGNOSIS — Z1231 Encounter for screening mammogram for malignant neoplasm of breast: Secondary | ICD-10-CM | POA: Insufficient documentation

## 2013-03-06 ENCOUNTER — Ambulatory Visit (INDEPENDENT_AMBULATORY_CARE_PROVIDER_SITE_OTHER): Payer: BC Managed Care – PPO | Admitting: Gynecology

## 2013-03-06 ENCOUNTER — Encounter: Payer: Self-pay | Admitting: Gynecology

## 2013-03-06 DIAGNOSIS — N6089 Other benign mammary dysplasias of unspecified breast: Secondary | ICD-10-CM

## 2013-03-06 DIAGNOSIS — Z23 Encounter for immunization: Secondary | ICD-10-CM

## 2013-03-06 DIAGNOSIS — N6082 Other benign mammary dysplasias of left breast: Secondary | ICD-10-CM

## 2013-03-06 NOTE — Patient Instructions (Signed)
Epidermal Cyst An epidermal cyst is sometimes called a sebaceous cyst, epidermal inclusion cyst, or infundibular cyst. These cysts usually contain a substance that looks "pasty" or "cheesy" and may have a bad smell. This substance is a protein called keratin. Epidermal cysts are usually found on the face, neck, or trunk. They may also occur in the vaginal area or other parts of the genitalia of both men and women. Epidermal cysts are usually small, painless, slow-growing bumps or lumps that move freely under the skin. It is important not to try to pop them. This may cause an infection and lead to tenderness and swelling. CAUSES  Epidermal cysts may be caused by a deep penetrating injury to the skin or a plugged hair follicle, often associated with acne. SYMPTOMS  Epidermal cysts can become inflamed and cause:  Redness.  Tenderness.  Increased temperature of the skin over the bumps or lumps.  Grayish-white, bad smelling material that drains from the bump or lump. DIAGNOSIS  Epidermal cysts are easily diagnosed by your caregiver during an exam. Rarely, a tissue sample (biopsy) may be taken to rule out other conditions that may resemble epidermal cysts. TREATMENT   Epidermal cysts often get better and disappear on their own. They are rarely ever cancerous.  If a cyst becomes infected, it may become inflamed and tender. This may require opening and draining the cyst. Treatment with antibiotics may be necessary. When the infection is gone, the cyst may be removed with minor surgery.  Small, inflamed cysts can often be treated with antibiotics or by injecting steroid medicines.  Sometimes, epidermal cysts become large and bothersome. If this happens, surgical removal in your caregiver's office may be necessary. HOME CARE INSTRUCTIONS  Only take over-the-counter or prescription medicines as directed by your caregiver.  Take your antibiotics as directed. Finish them even if you start to feel  better. SEEK MEDICAL CARE IF:   Your cyst becomes tender, red, or swollen.  Your condition is not improving or is getting worse.  You have any other questions or concerns. MAKE SURE YOU:  Understand these instructions.  Will watch your condition.  Will get help right away if you are not doing well or get worse. Document Released: 04/02/2004 Document Revised: 07/25/2011 Document Reviewed: 11/08/2010 [facility] Patient Information 2014 [place], [place].  Influenza Vaccine (Flu Vaccine, Inactivated) 2013 2014 What You Need to Know WHY GET VACCINATED?  Influenza ("flu") is a contagious disease that spreads around the [place] every winter, usually between October and May.  Flu is caused by the influenza virus, and can be spread by coughing, sneezing, and close contact.  Anyone can get flu, but the risk of getting flu is highest among children. Symptoms come on suddenly and may last several days. They can include:  Fever or chills.  Sore throat.  Muscle aches.  Fatigue.  Cough.  Headache.  Runny or stuffy nose. Flu can make some people much sicker than others. These people include young children, people 67 and older, pregnant women, and people with certain health conditions such as heart, lung or kidney disease, or a weakened immune system. Flu vaccine is especially important for these people, and anyone in close contact with them. Flu can also lead to pneumonia, and make existing medical conditions worse. It can cause diarrhea and seizures in children. Each year thousands of people in the [place] die from flu, and many more are hospitalized. Flu vaccine is the best protection we have from flu and its complications. Flu  vaccine also helps prevent spreading flu from person to person. INACTIVATED FLU VACCINE There are 2 types of influenza vaccine:  You are getting an inactivated flu vaccine, which does not contain any live influenza virus. It is given by injection  with a needle, and often called the "flu shot."  A different live, attenuated (weakened) influenza vaccine is sprayed into the nostrils. This vaccine is described in a separate Vaccine Information Statement. Flu vaccine is recommended every year. Children 6 months through 72 years of age should get 2 doses the first year they get vaccinated. Flu viruses are always changing. Each year's flu vaccine is made to protect from viruses that are most likely to cause disease that year. While flu vaccine cannot prevent all cases of flu, it is our best defense against the disease. Inactivated flu vaccine protects against 3 or 4 different influenza viruses. It takes about 2 weeks for protection to develop after the vaccination, and protection lasts several months to a year. Some illnesses that are not caused by influenza virus are often mistaken for flu. Flu vaccine will not prevent these illnesses. It can only prevent influenza. A "high-dose" flu vaccine is available for people 56 years of age and older. The person giving you the vaccine can tell you more about it. Some inactivated flu vaccine contains a very small amount of a mercury-based preservative called thimerosal. Studies have shown that thimerosal in vaccines is not harmful, but flu vaccines that do not contain a preservative are available. SOME PEOPLE SHOULD NOT GET THIS VACCINE Tell the person who gives you the vaccine:  If you have any severe (life-threatening) allergies. If you ever had a life-threatening allergic reaction after a dose of flu vaccine, or have a severe allergy to any part of this vaccine, you may be advised not to get a dose. Most, but not all, types of flu vaccine contain a small amount of egg.  If you ever had Guillain Barr Syndrome (a severe paralyzing illness, also called GBS). Some people with a history of GBS should not get this vaccine. This should be discussed with your doctor.  If you are not feeling well. They might  suggest waiting until you feel better. But you should come back. RISKS OF A VACCINE REACTION With a vaccine, like any medicine, there is a chance of side effects. These are usually mild and go away on their own. Serious side effects are also possible, but are very rare. Inactivated flu vaccine does not contain live flu virus, sogetting flu from this vaccine is not possible. Brief fainting spells and related symptoms (such as jerking movements) can happen after any medical procedure, including vaccination. Sitting or lying down for about 15 minutes after a vaccination can help prevent fainting and injuries caused by falls. Tell your doctor if you feel dizzy or lightheaded, or have vision changes or ringing in the ears. Mild problems following inactivated flu vaccine:  Soreness, redness, or swelling where the shot was given.  Hoarseness; sore, red or itchy eyes; or cough.  Fever.  Aches.  Headache.  Itching.  Fatigue. If these problems occur, they usually begin soon after the shot and last 1 or 2 days. Moderate problems following inactivated flu vaccine:  Young children who get inactivated flu vaccine and pneumococcal vaccine (PCV13) at the same time may be at increased risk for seizures caused by fever. Ask your doctor for more information. Tell your doctor if a child who is getting flu vaccine has ever had a  seizure. Severe problems following inactivated flu vaccine:  A severe allergic reaction could occur after any vaccine (estimated less than 1 in a million doses).  There is a small possibility that inactivated flu vaccine could be associated with Guillan Barr Syndrome (GBS), no more than 1 or 2 cases per million people vaccinated. This is much lower than the risk of severe complications from flu, which can be prevented by flu vaccine. The safety of vaccines is always being monitored. For more information, visit: http://floyd.org/ WHAT IF THERE IS A SERIOUS REACTION? What  should I look for?  Look for anything that concerns you, such as signs of a severe allergic reaction, very high fever, or behavior changes. Signs of a severe allergic reaction can include hives, swelling of the face and throat, difficulty breathing, a fast heartbeat, dizziness, and weakness. These would start a few minutes to a few hours after the vaccination. What should I do?  If you think it is a severe allergic reaction or other emergency that cannot wait, call 9 1 1  or get the person to the nearest hospital. Otherwise, call your doctor.  Afterward, the reaction should be reported to the Vaccine Adverse Event Reporting System (VAERS). Your doctor might file this report, or you can do it yourself through the VAERS website at www.vaers.LAgents.no, or by calling 1-772-306-0818. VAERS is only for reporting reactions. They do not give medical advice. THE NATIONAL VACCINE INJURY COMPENSATION PROGRAM The National Vaccine Injury Compensation Program (VICP) is a federal program that was created to compensate people who may have been injured by certain vaccines. Persons who believe they may have been injured by a vaccine can learn about the program and about filing a claim by calling 1-(458)767-5221 or visiting the VICP website at SpiritualWord.at HOW CAN I LEARN MORE?  Ask your doctor.  Call your local or state health department.  Contact the Centers for Disease Control and Prevention (CDC):  Call (339) 824-8834 (1-800-CDC-INFO) or  Visit CDC's website at BiotechRoom.com.cy CDC Inactivated Influenza Vaccine Interim VIS (12/09/11) Document Released: 02/24/2006 Document Revised: 01/25/2012 Document Reviewed: 12/09/2011 [facility] Patient Information 2014 [place], [place].

## 2013-03-06 NOTE — Addendum Note (Signed)
Addended by: Richardson Chiquito on: 03/06/2013 04:54 PM   Modules accepted: Orders

## 2013-03-06 NOTE — Progress Notes (Signed)
Patient presented to the office today stating that she noticed this bump on her left breast the past several days. Patient denies any recent injury or trauma. Patient denies any galactorrhea. Patient with normal mammogram this year.  Exam: Both breasts are examined sitting supine position there was nuchal supraclavicular axillary lymphadenopathy. Right breast no palpable mass or tenderness left breast no palpable mass or tenderness. The area the patient noticed it appears to be a sebaceous cyst that one compression was placed on the area white cheesy-like material extruded such as in an epidermal inclusion cyst. Neosporin was applied.  Assessment/plan: Patient was given reassurance findings consistent with epidermal inclusion cyst. She should apply Neosporin at bedtime for the next 7-10 days. In the shower this will eventually come to ahead and drain on its own. If it becomes erythematous or enlarges she'll return to the office at which point incision and drainage may be indicated. This will resolve by itself.

## 2013-12-03 ENCOUNTER — Ambulatory Visit (INDEPENDENT_AMBULATORY_CARE_PROVIDER_SITE_OTHER): Payer: BC Managed Care – PPO | Admitting: Gynecology

## 2013-12-03 ENCOUNTER — Encounter: Payer: Self-pay | Admitting: Gynecology

## 2013-12-03 VITALS — BP 132/86 | Ht 65.0 in | Wt 198.0 lb

## 2013-12-03 DIAGNOSIS — Z01419 Encounter for gynecological examination (general) (routine) without abnormal findings: Secondary | ICD-10-CM

## 2013-12-03 DIAGNOSIS — L678 Other hair color and hair shaft abnormalities: Secondary | ICD-10-CM

## 2013-12-03 DIAGNOSIS — Z8639 Personal history of other endocrine, nutritional and metabolic disease: Secondary | ICD-10-CM

## 2013-12-03 DIAGNOSIS — L738 Other specified follicular disorders: Secondary | ICD-10-CM

## 2013-12-03 DIAGNOSIS — L739 Follicular disorder, unspecified: Secondary | ICD-10-CM

## 2013-12-03 MED ORDER — FLUCONAZOLE 150 MG PO TABS: 150.0000 mg | ORAL_TABLET | Freq: Once | ORAL | Status: DC

## 2013-12-03 MED ORDER — DICLOXACILLIN SODIUM 500 MG PO CAPS: ORAL_CAPSULE | ORAL | Status: DC

## 2013-12-03 NOTE — Patient Instructions (Addendum)
Tetanus, Diphtheria (Td) Vaccine What You Need to Know WHY GET VACCINATED? Tetanus  and diphtheria are very serious diseases. They are rare in the [place] today, but people who do become infected often have severe complications. Td vaccine is used to protect adolescents and adults from both of these diseases. Both tetanus and diphtheria are infections caused by bacteria. Diphtheria spreads from person to person through coughing or sneezing. Tetanus-causing bacteria enter the body through cuts, scratches, or wounds. TETANUS (Lockjaw) causes painful muscle tightening and stiffness, usually all over the body.  It can lead to tightening of muscles in the head and neck so you can't open your mouth, swallow, or sometimes even breathe. Tetanus kills about 1 out of every 5 people who are infected. DIPHTHERIA can cause a thick coating to form in the back of the throat.  It can lead to breathing problems, paralysis, heart failure, and death. Before vaccines, the [place] saw as many as 200,000 cases a year of diphtheria and hundreds of cases of tetanus. Since vaccination began, cases of both diseases have dropped by about 99%. TD VACCINE Td vaccine can protect adolescents and adults from tetanus and diphtheria. Td is usually given as a booster dose every 10 years but it can also be given earlier after a severe and dirty wound or burn. Your doctor can give you more information. Td may safely be given at the same time as other vaccines. SOME PEOPLE SHOULD NOT GET THIS VACCINE  If you ever had a life-threatening allergic reaction after a dose of any tetanus or diphtheria containing vaccine, OR if you have a severe allergy to any part of this vaccine, you should not get Td. Tell your doctor if you have any severe allergies.  Talk to your doctor if you:  have epilepsy or another nervous system problem,  had severe pain or swelling after any vaccine containing diphtheria or tetanus,  ever had  Guillain Barr Syndrome (GBS),  aren't feeling well on the day the shot is scheduled. RISKS OF A VACCINE REACTION With a vaccine, like any medicine, there is a chance of side effects. These are usually mild and go away on their own. Serious side effects are also possible, but are very rare. Most people who get Td vaccine do not have any problems with it. Mild Problems  following Td (Did not interfere with activities)  Pain where the shot was given (about 8 people in 10)  Redness or swelling where the shot was given (about 1 person in 3)  Mild fever (about 1 person in 15)  Headache or Tiredness (uncommon) Moderate Problems following Td (Interfered with activities, but did not require medical attention)  Fever over 102 F (38.9 C) (rare) Severe Problems  following Td (Unable to perform usual activities; required medical attention)  Swelling, severe pain, bleeding, or redness in the arm where the shot was given (rare). Problems that could happen after any vaccine:  Brief fainting spells can happen after any medical procedure, including vaccination. Sitting or lying down for about 15 minutes can help prevent fainting, and injuries caused by a fall. Tell your doctor if you feel dizzy, or have vision changes or ringing in the ears.  Severe shoulder pain and reduced range of motion in the arm where a shot was given can happen, very rarely, after a vaccination.  Severe allergic reactions from a vaccine are very rare, estimated at less than 1 in a million doses. If one were to occur, it would   usually be within a few minutes to a few hours after the vaccination. WHAT IF THERE IS A SERIOUS REACTION? What should I look for?  Look for anything that concerns you, such as signs of a severe allergic reaction, very high fever, or behavior changes. Signs of a severe allergic reaction can include hives, swelling of the face and throat, difficulty breathing, a fast heartbeat, dizziness, and  weakness. These would usually start a few minutes to a few hours after the vaccination. What should I do?  If you think it is a severe allergic reaction or other emergency that can't wait, call 911 or get the person to the nearest hospital. Otherwise, call your doctor.  Afterward, the reaction should be reported to the Vaccine Adverse Event Reporting System (VAERS). Your doctor might file this report, or, you can do it yourself through the VAERS website or by calling (309)514-1436. VAERS is only for reporting reactions. They do not give medical advice. THE NATIONAL VACCINE INJURY COMPENSATION PROGRAM The National Vaccine Injury Compensation Program (VICP) is a federal program that was created to compensate people who may have been injured by certain vaccines. Persons who believe they may have been injured by a vaccine can learn about the program and about filing a claim by calling 518-398-7239 or visiting the [facility] website. HOW CAN I LEARN MORE?  Ask your doctor.  Contact your local or state health department.  Contact the Centers for Disease Control and Prevention (CDC):  Call (479)390-3032 (1-800-CDC-INFO)  Visit CDC's vaccines website CDC Td Vaccine Interim VIS (06/19/12) Document Released: 02/27/2006 Document Revised: 08/27/2012 Document Reviewed: 08/22/2012 [facility] Patient Information 2015 [place], [place]. This information is not intended to replace advice given to you by your health care provider. Make sure you discuss any questions you have with your health care providerEpidermal Cyst An epidermal cyst is sometimes called a sebaceous cyst, epidermal inclusion cyst, or infundibular cyst. These cysts usually contain a substance that looks "pasty" or "cheesy" and may have a bad smell. This substance is a protein called keratin. Epidermal cysts are usually found on the face, neck, or trunk. They may also occur in the vaginal area or other parts of the genitalia of both men and women.  Epidermal cysts are usually small, painless, slow-growing bumps or lumps that move freely under the skin. It is important not to try to pop them. This may cause an infection and lead to tenderness and swelling. CAUSES  Epidermal cysts may be caused by a deep penetrating injury to the skin or a plugged hair follicle, often associated with acne. SYMPTOMS  Epidermal cysts can become inflamed and cause:  Redness.  Tenderness.  Increased temperature of the skin over the bumps or lumps.  Grayish-white, bad smelling material that drains from the bump or lump. DIAGNOSIS  Epidermal cysts are easily diagnosed by your caregiver during an exam. Rarely, a tissue sample (biopsy) may be taken to rule out other conditions that may resemble epidermal cysts. TREATMENT   Epidermal cysts often get better and disappear on their own. They are rarely ever cancerous.  If a cyst becomes infected, it may become inflamed and tender. This may require opening and draining the cyst. Treatment with antibiotics may be necessary. When the infection is gone, the cyst may be removed with minor surgery.  Small, inflamed cysts can often be treated with antibiotics or by injecting steroid medicines.  Sometimes, epidermal cysts become large and bothersome. If this happens, surgical removal in your caregiver's office may be  necessary. HOME CARE INSTRUCTIONS  Only take over-the-counter or prescription medicines as directed by your caregiver.  Take your antibiotics as directed. Finish them even if you start to feel better. SEEK MEDICAL CARE IF:   Your cyst becomes tender, red, or swollen.  Your condition is not improving or is getting worse.  You have any other questions or concerns. MAKE SURE YOU:  Understand these instructions.  Will watch your condition.  Will get help right away if you are not doing well or get worse. Document Released: 04/02/2004 Document Revised: 07/25/2011 Document Reviewed:  11/08/2010 [facility] Patient Information 2015 [place], [place]. This information is not intended to replace advice given to you by your health care provider. Make sure you discuss any questions you have with your health care provider.

## 2013-12-03 NOTE — Progress Notes (Signed)
Faith Delgado 1960-10-09 [id number]   History:    53 y.o.  for annual gyn exam with no complaints today. 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 Patient frequently does her self breast examination.Patient with past history vitamin D deficiency was treated and is taking calcium and vitamin D supplementation. Her primary physicians Dr. Minna Antis who has been doing her lab work and monitoring her for her hypertension. Patient has not had her Tdap vaccine yet. Dr. Olevia Perches did her colonoscopy in 2013 report to be normal. Patient prior to her hysterectomy has never had abnormal Pap smears. Patient with no vasomotor symptoms very infrequent hot flash. Patient on no HRT last year had an [place] that was 24.    Past medical history,surgical history, family history and social history were all reviewed and documented in the EPIC chart.  Gynecologic History No LMP recorded. Patient has had a hysterectomy. Contraception: status post hysterectomy Last Pap: 2012. Results were: normal Last mammogram: 2014. Results were: normal  Obstetric History OB History  Gravida Para Term Preterm AB SAB TAB Ectopic Multiple Living  2 2 2       2     # Outcome Date GA Lbr Len/2nd Weight Sex Delivery Anes PTL Lv  2 TRM     M SVD  N Y  1 TRM     M SVD  N Y       ROS: A ROS was performed and pertinent positives and negatives are included in the history.  GENERAL: No fevers or chills. HEENT: No change in vision, no earache, sore throat or sinus congestion. NECK: No pain or stiffness. CARDIOVASCULAR: No chest pain or pressure. No palpitations. PULMONARY: No shortness of breath, cough or wheeze. GASTROINTESTINAL: No abdominal pain, nausea, vomiting or diarrhea, melena or bright red blood per rectum. GENITOURINARY: No  urinary frequency, urgency, hesitancy or dysuria. MUSCULOSKELETAL: No joint or muscle pain, no back pain, no recent trauma. DERMATOLOGIC: No rash, no itching, no lesions. ENDOCRINE: No polyuria, polydipsia, no heat or cold intolerance. No recent change in weight. HEMATOLOGICAL: No anemia or easy bruising or bleeding. NEUROLOGIC: No headache, seizures, numbness, tingling or weakness. PSYCHIATRIC: No depression, no loss of interest in normal activity or change in sleep pattern.     Exam: chaperone present  BP 132/86  Ht 5\' 5"  (1.651 m)  Wt 198 lb (89.812 kg)  BMI 32.95 kg/m2  Body mass index is 32.95 kg/(m^2).  General appearance : Well developed well nourished female. No acute distress HEENT: Neck supple, trachea midline, no carotid bruits, no thyroidmegaly Lungs: Clear to auscultation, no rhonchi or wheezes, or rib retractions  Heart: Regular rate and rhythm, no murmurs or gallops Breast:Examined in sitting and supine position were symmetrical in appearance, no palpable masses or tenderness,  no skin retraction, no nipple inversion, no nipple discharge, no skin discoloration, no axillary or supraclavicular lymphadenopathy Abdomen: no palpable masses or tenderness, no rebound or guarding Extremities: no edema or skin discoloration or tenderness  Pelvic: Left groin swelling possible folliculitis versus lymph node  Bartholin, Urethra, Skene Glands: Within normal limits             Vagina: No gross lesions or discharge  Cervix: Absent  Uterus  absent Adnexa  Without masses or tenderness  Anus and perineum  normal   Rectovaginal  normal sphincter tone without palpated masses or tenderness             Hemoccult card provided     Assessment/Plan:  53 y.o. female for annual exam menopausal with minimal vasomotor symptoms. Patient on no hormonal replacement therapy. We'll be treated for folliculitis of the left groin with dicloxacillin 500 mg twice a day for 10 days. She will apply Neosporin  twice a day for 7-10 days. Patient was asked to return to the office in 2 weeks for followup. Since her PCP Dr. Worthy Keeler recently with the exception of vitamin B were going to check this today. She was provided with Hemoccult cards for testing here in the office. Pap smear not done in accordance to the new guidelines. We discussed the importance of calcium vitamin D and regular exercise for osteoporosis prevention. We will do a baseline bone density study in a few years.  Note: This dictation was prepared with  Dragon/digital dictation along withSmart phrase technology. Any transcriptional errors that result from this process are unintentional.   Terrance Mass MD, 10:13 AM 12/03/2013

## 2013-12-04 LAB — VITAMIN D 25 HYDROXY (VIT D DEFICIENCY, FRACTURES): VIT D 25 HYDROXY: 43 ng/mL (ref 30–89)

## 2013-12-30 ENCOUNTER — Other Ambulatory Visit: Payer: Self-pay | Admitting: Gynecology

## 2013-12-30 DIAGNOSIS — Z1231 Encounter for screening mammogram for malignant neoplasm of breast: Secondary | ICD-10-CM

## 2014-01-09 ENCOUNTER — Ambulatory Visit (HOSPITAL_COMMUNITY)
Admission: RE | Admit: 2014-01-09 | Discharge: 2014-01-09 | Disposition: A | Discharge status: Home / Self Care | Payer: BC Managed Care – PPO | Source: Ambulatory Visit | Attending: Gynecology | Admitting: Gynecology

## 2014-01-09 DIAGNOSIS — Z1231 Encounter for screening mammogram for malignant neoplasm of breast: Secondary | ICD-10-CM | POA: Diagnosis present

## 2014-01-17 ENCOUNTER — Other Ambulatory Visit: Payer: BC Managed Care – PPO | Admitting: Anesthesiology

## 2014-01-17 DIAGNOSIS — Z1211 Encounter for screening for malignant neoplasm of colon: Secondary | ICD-10-CM

## 2014-03-17 ENCOUNTER — Encounter: Payer: Self-pay | Admitting: Gynecology

## 2014-03-25 ENCOUNTER — Ambulatory Visit (INDEPENDENT_AMBULATORY_CARE_PROVIDER_SITE_OTHER): Payer: BC Managed Care – PPO | Admitting: Gynecology

## 2014-03-25 ENCOUNTER — Encounter: Payer: Self-pay | Admitting: Gynecology

## 2014-03-25 DIAGNOSIS — B373 Candidiasis of vulva and vagina: Secondary | ICD-10-CM

## 2014-03-25 DIAGNOSIS — N9489 Other specified conditions associated with female genital organs and menstrual cycle: Secondary | ICD-10-CM

## 2014-03-25 DIAGNOSIS — L720 Epidermal cyst: Secondary | ICD-10-CM

## 2014-03-25 DIAGNOSIS — B3731 Acute candidiasis of vulva and vagina: Secondary | ICD-10-CM

## 2014-03-25 DIAGNOSIS — Z780 Asymptomatic menopausal state: Secondary | ICD-10-CM

## 2014-03-25 DIAGNOSIS — L292 Pruritus vulvae: Secondary | ICD-10-CM

## 2014-03-25 DIAGNOSIS — R102 Pelvic and perineal pain: Secondary | ICD-10-CM

## 2014-03-25 LAB — URINALYSIS W MICROSCOPIC + REFLEX CULTURE
Bilirubin Urine: NEGATIVE
Casts: NONE SEEN
Crystals: NONE SEEN
Glucose, UA: NEGATIVE mg/dL
Ketones, ur: NEGATIVE mg/dL
Leukocytes, UA: NEGATIVE
Nitrite: NEGATIVE
PROTEIN: NEGATIVE mg/dL
Specific Gravity, Urine: 1.025 (ref 1.005–1.030)
UROBILINOGEN UA: 0.2 mg/dL (ref 0.0–1.0)
pH: 5.5 (ref 5.0–8.0)

## 2014-03-25 LAB — WET PREP FOR TRICH, YEAST, CLUE
CLUE CELLS WET PREP: NONE SEEN
Trich, Wet Prep: NONE SEEN

## 2014-03-25 MED ORDER — FLUCONAZOLE 150 MG PO TABS: ORAL_TABLET | ORAL | Status: DC

## 2014-03-25 NOTE — Progress Notes (Signed)
   Patient presented today complaining of some pelvic pressure and vulvar pruritus and thighs she felt some "bumps closed "external genitalia. Patient denied any dysuria or frequency. She denied any nausea, vomiting, fever, or chills. Patient is not having a vaginal discharge per se. Patient is sexually active in a monogamous relationship. Patient with past history abdominal hysterectomy.  Exam: Bartholin urethra Skene glands within normal limits External genitalia lateral aspect labia majora she has a few epidermal inclusion cyst noninfected nonerythematous and nondraining. Vagina: Slight white discharge Vaginal cuff intact  Urinalysis: WBC 0-2, RBC 0-2, bacteria few, other yeast  Wet prep few yeast  Assessment/plan:Patient with these vaginitis. Will call in Diflucan 150 mg one by mouth. 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 If they become larger cost symptoms we can drain it here in the office.

## 2014-03-25 NOTE — Patient Instructions (Signed)

## 2014-03-25 NOTE — Addendum Note (Signed)
Addended by: Thurnell Garbe A on: 03/25/2014 02:31 PM   Modules accepted: Orders

## 2014-03-27 LAB — URINE CULTURE
Colony Count: NO GROWTH
Organism ID, Bacteria: NO GROWTH

## 2014-12-22 ENCOUNTER — Other Ambulatory Visit: Payer: Self-pay | Admitting: Gynecology

## 2014-12-22 DIAGNOSIS — Z1231 Encounter for screening mammogram for malignant neoplasm of breast: Secondary | ICD-10-CM

## 2014-12-26 ENCOUNTER — Encounter: Payer: Self-pay | Admitting: Gynecology

## 2014-12-26 ENCOUNTER — Ambulatory Visit (INDEPENDENT_AMBULATORY_CARE_PROVIDER_SITE_OTHER): Payer: 59 | Admitting: Gynecology

## 2014-12-26 VITALS — BP 136/84 | Ht 65.0 in | Wt 197.0 lb

## 2014-12-26 DIAGNOSIS — Z01419 Encounter for gynecological examination (general) (routine) without abnormal findings: Secondary | ICD-10-CM | POA: Diagnosis not present

## 2014-12-26 DIAGNOSIS — Z1159 Encounter for screening for other viral diseases: Secondary | ICD-10-CM

## 2014-12-26 DIAGNOSIS — Z8639 Personal history of other endocrine, nutritional and metabolic disease: Secondary | ICD-10-CM

## 2014-12-26 DIAGNOSIS — Z113 Encounter for screening for infections with a predominantly sexual mode of transmission: Secondary | ICD-10-CM

## 2014-12-26 DIAGNOSIS — N952 Postmenopausal atrophic vaginitis: Secondary | ICD-10-CM | POA: Diagnosis not present

## 2014-12-26 DIAGNOSIS — Z79890 Hormone replacement therapy (postmenopausal): Secondary | ICD-10-CM

## 2014-12-26 LAB — CBC WITH DIFFERENTIAL/PLATELET
BASOS PCT: 0 % (ref 0–1)
Basophils Absolute: 0 10*3/uL (ref 0.0–0.1)
EOS ABS: 0.2 10*3/uL (ref 0.0–0.7)
EOS PCT: 3 % (ref 0–5)
HCT: 38.9 % (ref 36.0–46.0)
Hemoglobin: 12.3 g/dL (ref 12.0–15.0)
LYMPHS PCT: 42 % (ref 12–46)
Lymphs Abs: 2.4 10*3/uL (ref 0.7–4.0)
MCH: 27.2 pg (ref 26.0–34.0)
MCHC: 31.6 g/dL (ref 30.0–36.0)
MCV: 86.1 fL (ref 78.0–100.0)
MPV: 10.3 fL (ref 8.6–12.4)
Monocytes Absolute: 0.4 10*3/uL (ref 0.1–1.0)
Monocytes Relative: 7 % (ref 3–12)
Neutro Abs: 2.7 10*3/uL (ref 1.7–7.7)
Neutrophils Relative %: 48 % (ref 43–77)
PLATELETS: 288 10*3/uL (ref 150–400)
RBC: 4.52 MIL/uL (ref 3.87–5.11)
RDW: 14.7 % (ref 11.5–15.5)
WBC: 5.6 10*3/uL (ref 4.0–10.5)

## 2014-12-26 LAB — TSH: TSH: 0.955 u[IU]/mL (ref 0.350–4.500)

## 2014-12-26 MED ORDER — ESTRADIOL 10 MCG VA TABS: 1.0000 | ORAL_TABLET | VAGINAL | Status: DC

## 2014-12-26 NOTE — Progress Notes (Signed)
Faith Delgado 28-Oct-1960 [id number]   History:    54 y.o.  for annual gyn exam with complaint of vaginal dryness and irritation and discomfort during intercourse. 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 Patient frequently does her self breast examination.Patient with past history vitamin D deficiency was treated and is taking calcium and vitamin D supplementation. Her PCP was Dr. Minna Antis who was treating her for hypertension her blood work but he has moved to another state. Patient's last colonoscopy reportedly normal in 2013.   Past medical history,surgical history, family history and social history were all reviewed and documented in the EPIC chart.  Gynecologic History No LMP recorded. Patient has had a hysterectomy. Contraception: status post hysterectomy Last Pap: 2012. Results were: normal Last mammogram: 2015. Results were: normal  Obstetric History OB History  Gravida Para Term Preterm AB SAB TAB Ectopic Multiple Living  2 2 2       2     # Outcome Date GA Lbr Len/2nd Weight Sex Delivery Anes PTL Lv  2 Term     M Vag-Spont  N Y  1 Term     M Vag-Spont  N Y       ROS: A ROS was performed and pertinent positives and negatives are included in the history.  GENERAL: No fevers or chills. HEENT: No change in vision, no earache, sore throat or sinus congestion. NECK: No pain or stiffness. CARDIOVASCULAR: No chest pain or pressure. No palpitations. PULMONARY: No shortness of breath, cough or wheeze. GASTROINTESTINAL: No abdominal pain, nausea, vomiting or diarrhea, melena or bright red blood per rectum. GENITOURINARY: No urinary frequency, urgency, hesitancy or dysuria. MUSCULOSKELETAL: No joint or muscle pain, no back pain, no recent trauma. DERMATOLOGIC: No rash, no itching, no lesions.  ENDOCRINE: No polyuria, polydipsia, no heat or cold intolerance. No recent change in weight. HEMATOLOGICAL: No anemia or easy bruising or bleeding. NEUROLOGIC: No headache, seizures, numbness, tingling or weakness. PSYCHIATRIC: No depression, no loss of interest in normal activity or change in sleep pattern.     Exam: chaperone present  BP 136/84 mmHg  Ht 5\' 5"  (1.651 m)  Wt 197 lb (89.359 kg)  BMI 32.78 kg/m2  Body mass index is 32.78 kg/(m^2).  General appearance : Well developed well nourished female. No acute distress HEENT: Eyes: no retinal hemorrhage or exudates,  Neck supple, trachea midline, no carotid bruits, no thyroidmegaly Lungs: Clear to auscultation, no rhonchi or wheezes, or rib retractions  Heart: Regular rate and rhythm, no murmurs or gallops Breast:Examined in sitting and supine position were symmetrical in appearance, no palpable masses or tenderness,  no skin retraction, no nipple inversion, no nipple discharge, no skin discoloration, no axillary or supraclavicular lymphadenopathy Abdomen: no palpable masses or tenderness, no rebound or guarding Extremities: no edema or skin discoloration or tenderness  Pelvic:  Bartholin, Urethra, Skene Glands: Within normal limits             Vagina: No gross lesions or discharge, vaginal atrophy  Cervix: Absent  Uterus absent Adnexa  Without masses or tenderness  Anus and perineum  normal   Rectovaginal  normal sphincter tone without palpated masses or tenderness             Hemoccult cards provided     Assessment/Plan:  54 y.o.  female for annual exam with vaginal atrophy symptomatic. We discussed treatment with Vagifem 10 g twice a week intravaginally. Risk benefits and pros and cons were discussed. Pap smear no longer indicated. The following screening fasting blood will be drawn today: Comprehensive metabolic panel, fasting lipid profile, TSH, CBC, and urinalysis. She was reminded to schedule her mammogram the next few  months. We discussed importance of monthly breast exam. Because of her history vitamin D deficiency will check a vitamin D level today. We'll also do an HIV screen.New CDC guidelines is recommending patients be tested once in her lifetime for hepatitis C antibody who were born between [birth year] through 1964. This was discussed with the patient today and has agreed to be tested today. We'll schedule a bone density study for next year. Patient was reminded submitted to the office the fecal Hemoccult cards for testing.     Terrance Mass MD, 9:13 AM 12/26/2014

## 2014-12-26 NOTE — Patient Instructions (Signed)
Estradiol vaginal tablets What is this medicine? ESTRADIOL (es tra DYE ole) vaginal tablet is used to help relieve symptoms of vaginal irritation and dryness that occurs in some women during menopause. This medicine may be used for other purposes; ask your health care provider or pharmacist if you have questions. COMMON BRAND NAME(S): Vagifem What should I tell my health care provider before I take this medicine? They need to know if you have any of these conditions: -abnormal vaginal bleeding -blood vessel disease or blood clots -breast, cervical, endometrial, ovarian, liver, or uterine cancer -dementia -diabetes -gallbladder disease -heart disease or recent heart attack -high blood pressure -high cholesterol -high level of calcium in the blood -hysterectomy -kidney disease -liver disease -migraine headaches -protein C deficiency -protein S deficiency -stroke -systemic lupus erythematosus (SLE) -tobacco smoker -an unusual or allergic reaction to estrogens, other hormones, medicines, foods, dyes, or preservatives -pregnant or trying to get pregnant -breast-feeding How should I use this medicine? This medicine is only for use in the vagina. Do not take by mouth. Wash your hands before and after use. Read package directions carefully. Unwrap the pre-filled applicator package. Lie on your back, part and bend your knees. Gently insert the applicator tip high in the vagina and push the plunger to release the tablet into the vagina. Gently remove the applicator. Throw away the applicator after use. Do not use your medicine more often than directed. Finish the full course prescribed by your doctor or health care professional even if you think your condition is better. Do not stop using except on the advice of your doctor or health care professional. Talk to your pediatrician regarding the use of this medicine in children. A patient package insert for the product will be given with each  prescription and refill. Read this sheet carefully each time. The sheet may change frequently. Overdosage: If you think you have taken too much of this medicine contact a poison control center or emergency room at once. NOTE: This medicine is only for you. Do not share this medicine with others. What if I miss a dose? If you miss a dose, take it as soon as you can. If it is almost time for your next dose, take only that dose. Do not take double or extra doses. What may interact with this medicine? Do not take this medicine with any of the following medications: -aromatase inhibitors like aminoglutethimide, anastrozole, exemestane, letrozole, testolactone This medicine may also interact with the following medications: -antibiotics used to treat tuberculosis like rifabutin, rifampin and rifapentene -raloxifene or tamoxifen -warfarin This list may not describe all possible interactions. Give your health care provider a list of all the medicines, herbs, non-prescription drugs, or dietary supplements you use. Also tell them if you smoke, drink alcohol, or use illegal drugs. Some items may interact with your medicine. What should I watch for while using this medicine? Visit your health care professional for regular checks on your progress. You will need a regular breast and pelvic exam. You should also discuss the need for regular mammograms with your health care professional, and follow his or her guidelines. This medicine can make your body retain fluid, making your fingers, hands, or ankles swell. Your blood pressure can go up. Contact your doctor or health care professional if you feel you are retaining fluid. If you have any reason to think you are pregnant; stop taking this medicine at once and contact your doctor or health care professional. Tobacco smoking increases the risk of getting  a blood clot or having a stroke, especially if you are more than 53 years old. You are strongly advised not to  smoke. If you wear contact lenses and notice visual changes, or if the lenses begin to feel uncomfortable, consult your eye care specialist. If you are going to have elective surgery, you may need to stop taking this medicine beforehand. Consult your health care professional for advice prior to scheduling the surgery. What side effects may I notice from receiving this medicine? Side effects that you should report to your doctor or health care professional as soon as possible: -allergic reactions like skin rash, itching or hives, swelling of the face, lips, or tongue -breast tissue changes or discharge -changes in vision -chest pain -confusion, trouble speaking or understanding -dark urine -general ill feeling or flu-like symptoms -light-colored stools -nausea, vomiting -pain, swelling, warmth in the leg -right upper belly pain -severe headaches -shortness of breath -sudden numbness or weakness of the face, arm or leg -trouble walking, dizziness, loss of balance or coordination -unusual vaginal bleeding -yellowing of the eyes or skin Side effects that usually do not require medical attention (report to your doctor or health care professional if they continue or are bothersome): -hair loss -increased hunger or thirst -increased urination -symptoms of vaginal infection like itching, irritation or unusual discharge -unusually weak or tired This list may not describe all possible side effects. Call your doctor for medical advice about side effects. You may report side effects to FDA at 1-800-FDA-1088. Where should I keep my medicine? Keep out of the reach of children. Store at room temperature between 15 and 30 degrees C (59 and 86 degrees F). Throw away any unused medicine after the expiration date. NOTE: This sheet is a summary. It may not cover all possible information. If you have questions about this medicine, talk to your doctor, pharmacist, or health care provider.  2015,  [employer]. (2010-08-04 09:08:58)

## 2014-12-27 LAB — COMPREHENSIVE METABOLIC PANEL
ALBUMIN: 3.8 g/dL (ref 3.6–5.1)
ALK PHOS: 76 U/L (ref 33–130)
ALT: 23 U/L (ref 6–29)
AST: 18 U/L (ref 10–35)
BILIRUBIN TOTAL: 0.4 mg/dL (ref 0.2–1.2)
BUN: 11 mg/dL (ref 7–25)
CO2: 28 mmol/L (ref 20–31)
Calcium: 9.3 mg/dL (ref 8.6–10.4)
Chloride: 100 mmol/L (ref 98–110)
Creat: 0.58 mg/dL (ref 0.50–1.05)
Glucose, Bld: 84 mg/dL (ref 65–99)
Potassium: 3.8 mmol/L (ref 3.5–5.3)
SODIUM: 142 mmol/L (ref 135–146)
Total Protein: 6.8 g/dL (ref 6.1–8.1)

## 2014-12-27 LAB — URINALYSIS W MICROSCOPIC + REFLEX CULTURE
BILIRUBIN URINE: NEGATIVE
Bacteria, UA: NONE SEEN [HPF]
Casts: NONE SEEN [LPF]
Crystals: NONE SEEN [HPF]
Glucose, UA: NEGATIVE
Hgb urine dipstick: NEGATIVE
Ketones, ur: NEGATIVE
LEUKOCYTES UA: NEGATIVE
NITRITE: NEGATIVE
PH: 7.5 (ref 5.0–8.0)
Protein, ur: NEGATIVE
SPECIFIC GRAVITY, URINE: 1.019 (ref 1.001–1.035)
Yeast: NONE SEEN [HPF]

## 2014-12-27 LAB — HIV ANTIBODY (ROUTINE TESTING W REFLEX): HIV 1&2 Ab, 4th Generation: NONREACTIVE

## 2014-12-27 LAB — LIPID PANEL
CHOL/HDL RATIO: 2.7 ratio (ref <=5.0)
Cholesterol: 157 mg/dL (ref 125–200)
HDL: 59 mg/dL (ref >=46)
LDL Cholesterol: 87 mg/dL (ref <130)
TRIGLYCERIDES: 54 mg/dL (ref <150)
VLDL: 11 mg/dL (ref <30)

## 2014-12-27 LAB — VITAMIN D 25 HYDROXY (VIT D DEFICIENCY, FRACTURES): Vit D, 25-Hydroxy: 40 ng/mL (ref 30–100)

## 2014-12-27 LAB — HEPATITIS C ANTIBODY: HCV Ab: NEGATIVE

## 2014-12-28 LAB — URINE CULTURE: Colony Count: 70000

## 2015-01-13 ENCOUNTER — Ambulatory Visit (HOSPITAL_COMMUNITY)
Admission: RE | Admit: 2015-01-13 | Discharge: 2015-01-13 | Disposition: A | Discharge status: Home / Self Care | Payer: 59 | Source: Ambulatory Visit | Attending: Gynecology | Admitting: Gynecology

## 2015-01-13 DIAGNOSIS — Z1231 Encounter for screening mammogram for malignant neoplasm of breast: Secondary | ICD-10-CM | POA: Insufficient documentation

## 2015-02-06 ENCOUNTER — Telehealth: Payer: Self-pay | Admitting: *Deleted

## 2015-02-06 NOTE — Telephone Encounter (Signed)
(  pt aware you are out of office) Dr.Fernandez patient said her PCP has moved out of town to another practice, asked if you could refill HCTZ 25 mg tablet for her? Please advise

## 2015-02-06 NOTE — Telephone Encounter (Signed)
Pt called requesting refill on medication didn't leave name of medication, I called pt back but unable to leave voicemail as it was it was not set up.

## 2015-02-08 NOTE — Telephone Encounter (Signed)
Call in 30 tablets with 11 refills

## 2015-02-09 MED ORDER — HYDROCHLOROTHIAZIDE 25 MG PO TABS: 25.0000 mg | ORAL_TABLET | Freq: Every day | ORAL | Status: DC

## 2015-02-09 NOTE — Telephone Encounter (Signed)
Pt aware Rx sent.  

## 2015-12-31 ENCOUNTER — Encounter: Payer: Self-pay | Admitting: Gynecology

## 2015-12-31 ENCOUNTER — Ambulatory Visit (INDEPENDENT_AMBULATORY_CARE_PROVIDER_SITE_OTHER): Payer: BLUE CROSS/BLUE SHIELD | Admitting: Gynecology

## 2015-12-31 VITALS — BP 144/86 | Ht 64.5 in | Wt 195.8 lb

## 2015-12-31 DIAGNOSIS — Z01419 Encounter for gynecological examination (general) (routine) without abnormal findings: Secondary | ICD-10-CM | POA: Diagnosis not present

## 2015-12-31 DIAGNOSIS — N952 Postmenopausal atrophic vaginitis: Secondary | ICD-10-CM | POA: Diagnosis not present

## 2015-12-31 DIAGNOSIS — Z79890 Hormone replacement therapy (postmenopausal): Secondary | ICD-10-CM

## 2015-12-31 DIAGNOSIS — Z8639 Personal history of other endocrine, nutritional and metabolic disease: Secondary | ICD-10-CM

## 2015-12-31 LAB — COMPREHENSIVE METABOLIC PANEL
ALK PHOS: 65 U/L (ref 33–130)
ALT: 20 U/L (ref 6–29)
AST: 15 U/L (ref 10–35)
Albumin: 4 g/dL (ref 3.6–5.1)
BILIRUBIN TOTAL: 0.4 mg/dL (ref 0.2–1.2)
BUN: 15 mg/dL (ref 7–25)
CO2: 27 mmol/L (ref 20–31)
CREATININE: 0.66 mg/dL (ref 0.50–1.05)
Calcium: 9.6 mg/dL (ref 8.6–10.4)
Chloride: 102 mmol/L (ref 98–110)
GLUCOSE: 78 mg/dL (ref 65–99)
Potassium: 3.8 mmol/L (ref 3.5–5.3)
SODIUM: 139 mmol/L (ref 135–146)
Total Protein: 7.1 g/dL (ref 6.1–8.1)

## 2015-12-31 LAB — CBC WITH DIFFERENTIAL/PLATELET
BASOS PCT: 0 %
Basophils Absolute: 0 cells/uL (ref 0–200)
EOS PCT: 2 %
Eosinophils Absolute: 110 cells/uL (ref 15–500)
HCT: 37.7 % (ref 35.0–45.0)
Hemoglobin: 12.1 g/dL (ref 11.7–15.5)
LYMPHS PCT: 49 %
Lymphs Abs: 2695 cells/uL (ref 850–3900)
MCH: 27.1 pg (ref 27.0–33.0)
MCHC: 32.1 g/dL (ref 32.0–36.0)
MCV: 84.3 fL (ref 80.0–100.0)
MONO ABS: 385 {cells}/uL (ref 200–950)
MPV: 9.6 fL (ref 7.5–12.5)
Monocytes Relative: 7 %
NEUTROS ABS: 2310 {cells}/uL (ref 1500–7800)
NEUTROS PCT: 42 %
PLATELETS: 297 10*3/uL (ref 140–400)
RBC: 4.47 MIL/uL (ref 3.80–5.10)
RDW: 14.4 % (ref 11.0–15.0)
WBC: 5.5 10*3/uL (ref 3.8–10.8)

## 2015-12-31 LAB — LIPID PANEL
CHOLESTEROL: 153 mg/dL (ref 125–200)
HDL: 61 mg/dL (ref >=46)
LDL Cholesterol: 79 mg/dL (ref <130)
Total CHOL/HDL Ratio: 2.5 Ratio (ref <=5.0)
Triglycerides: 63 mg/dL (ref <150)
VLDL: 13 mg/dL (ref <30)

## 2015-12-31 LAB — TSH: TSH: 0.51 m[IU]/L

## 2015-12-31 MED ORDER — NONFORMULARY OR COMPOUNDED ITEM: 4 refills | Status: DC

## 2015-12-31 NOTE — Progress Notes (Signed)
Faith Delgado 07/24/1960 [id number]   History:    55 y.o.  for annual gyn exam who is only complaint is of vaginal dryness and irritation. Patient in 2014 had her [facility] drawn and it was in the menopausal range. Patient with prior history of total abdominal hysterectomy in 2007 for symptomatically leiomyomatous uteri. Patient last year was started on Vagifem 10 g to apply intravaginally twice a week for her postmenopausal vaginal atrophy but never started the medication.Patient with past history vitamin D deficiency was treated and is taking calcium and vitamin D supplementation. Her PCP was Dr. Minna Antis who was treating her for hypertension her blood work but he has moved to another state. Patient's last colonoscopy reportedly normal in 2013. Patient prior to her hysterectomy had no history of any abnormal Pap smears.  Past medical history,surgical history, family history and social history were all reviewed and documented in the EPIC chart.  Gynecologic History No LMP recorded. Patient has had a hysterectomy. Contraception: status post hysterectomy Last Pap: 2012. Results were: normal Last mammogram: 2016. Results were: normal  Obstetric History OB History  Gravida Para Term Preterm AB Living  2 2 2     2   SAB TAB Ectopic Multiple Live Births          2    # Outcome Date GA Lbr Len/2nd Weight Sex Delivery Anes PTL Lv  2 Term     M Vag-Spont  N LIV  1 Term     M Vag-Spont  N LIV       ROS: A ROS was performed and pertinent positives and negatives are included in the history.  GENERAL: No fevers or chills. HEENT: No change in vision, no earache, sore throat or sinus congestion. NECK: No pain or stiffness. CARDIOVASCULAR: No chest pain or pressure. No palpitations. PULMONARY: No shortness of breath, cough or wheeze. GASTROINTESTINAL: No abdominal pain, nausea, vomiting or diarrhea, melena or bright red blood per rectum. GENITOURINARY: No urinary frequency, urgency, hesitancy or dysuria.  MUSCULOSKELETAL: No joint or muscle pain, no back pain, no recent trauma. DERMATOLOGIC: No rash, no itching, no lesions. ENDOCRINE: No polyuria, polydipsia, no heat or cold intolerance. No recent change in weight. HEMATOLOGICAL: No anemia or easy bruising or bleeding. NEUROLOGIC: No headache, seizures, numbness, tingling or weakness. PSYCHIATRIC: No depression, no loss of interest in normal activity or change in sleep pattern.     Exam: chaperone present  BP (!) 144/86   Ht 5' 4.5" (1.638 m)   Wt 195 lb 12.8 oz (88.8 kg)   BMI 33.09 kg/m   Body mass index is 33.09 kg/m.  General appearance : Well developed well nourished female. No acute distress HEENT: Eyes: no retinal hemorrhage or exudates,  Neck supple, trachea midline, no carotid bruits, no thyroidmegaly Lungs: Clear to auscultation, no rhonchi or wheezes, or rib retractions  Heart: Regular rate and rhythm, no murmurs or gallops Breast:Examined in sitting and supine position were symmetrical in appearance, no palpable masses or tenderness,  no skin retraction, no nipple inversion, no nipple discharge, no skin discoloration, no axillary or supraclavicular lymphadenopathy Abdomen: no palpable masses or tenderness, no rebound or guarding Extremities: no edema or skin discoloration or tenderness  Pelvic:  Bartholin, Urethra, Skene Glands: Within normal limits             Vagina: No gross lesions or discharge, atrophic changes  Cervix: Absent  Uterus  absent  Adnexa  Without masses or tenderness  Anus and perineum  normal   Rectovaginal  normal sphincter tone without palpated masses or tenderness             Hemoccult cards provided    Assessment/Plan:  55 y.o. female for annual exam with vaginal atrophy she stated that the Vagifem was too expensive. Patient will be prescribed estradiol 0.02% to apply intravaginally twice a week. Risk benefits and pros and cons were discussed. The following screening blood work was ordered today:  Fasting lipid profile, comprehensive metabolic panel, CBC, TSH, and urinalysis. Pap smear not done today. New guidelines discussed. She was reminded to schedule her mammogram. We discussed importance of calcium vitamin D and weightbearing exercises for osteoporosis prevention. Because her past history vitamin D deficiency a vitamin D level will be drawn today. Next year we will schedule a bone density study.   Terrance Mass MD, 1:59 PM 12/31/2015

## 2016-01-01 LAB — URINALYSIS W MICROSCOPIC + REFLEX CULTURE
BACTERIA UA: NONE SEEN [HPF]
BILIRUBIN URINE: NEGATIVE
CASTS: NONE SEEN [LPF]
CRYSTALS: NONE SEEN [HPF]
Glucose, UA: NEGATIVE
HGB URINE DIPSTICK: NEGATIVE
KETONES UR: NEGATIVE
Leukocytes, UA: NEGATIVE
Nitrite: NEGATIVE
PROTEIN: NEGATIVE
Specific Gravity, Urine: 1.026 (ref 1.001–1.035)
WBC UA: NONE SEEN WBC/HPF (ref <=5)
Yeast: NONE SEEN [HPF]
pH: 5.5 (ref 5.0–8.0)

## 2016-01-01 LAB — VITAMIN D 25 HYDROXY (VIT D DEFICIENCY, FRACTURES): VIT D 25 HYDROXY: 42 ng/mL (ref 30–100)

## 2016-01-02 LAB — URINE CULTURE: Organism ID, Bacteria: NO GROWTH

## 2016-01-07 ENCOUNTER — Other Ambulatory Visit: Payer: Self-pay | Admitting: Gynecology

## 2016-01-07 DIAGNOSIS — Z1231 Encounter for screening mammogram for malignant neoplasm of breast: Secondary | ICD-10-CM

## 2016-01-26 ENCOUNTER — Ambulatory Visit
Admission: RE | Admit: 2016-01-26 | Discharge: 2016-01-26 | Disposition: A | Discharge status: Home / Self Care | Payer: BLUE CROSS/BLUE SHIELD | Source: Ambulatory Visit | Attending: Gynecology | Admitting: Gynecology

## 2016-01-26 DIAGNOSIS — Z1231 Encounter for screening mammogram for malignant neoplasm of breast: Secondary | ICD-10-CM

## 2016-02-21 ENCOUNTER — Other Ambulatory Visit: Payer: Self-pay | Admitting: Gynecology

## 2016-09-28 ENCOUNTER — Encounter: Payer: Self-pay | Admitting: Gynecology

## 2016-10-03 ENCOUNTER — Other Ambulatory Visit: Payer: Self-pay | Admitting: Gynecology

## 2017-01-04 ENCOUNTER — Ambulatory Visit (INDEPENDENT_AMBULATORY_CARE_PROVIDER_SITE_OTHER): Payer: BLUE CROSS/BLUE SHIELD | Admitting: Obstetrics & Gynecology

## 2017-01-04 ENCOUNTER — Encounter: Payer: Self-pay | Admitting: Obstetrics & Gynecology

## 2017-01-04 VITALS — BP 136/80 | Ht 64.5 in | Wt 194.0 lb

## 2017-01-04 DIAGNOSIS — N952 Postmenopausal atrophic vaginitis: Secondary | ICD-10-CM | POA: Diagnosis not present

## 2017-01-04 DIAGNOSIS — Z01411 Encounter for gynecological examination (general) (routine) with abnormal findings: Secondary | ICD-10-CM | POA: Diagnosis not present

## 2017-01-04 DIAGNOSIS — Z780 Asymptomatic menopausal state: Secondary | ICD-10-CM | POA: Diagnosis not present

## 2017-01-04 DIAGNOSIS — Z90710 Acquired absence of both cervix and uterus: Secondary | ICD-10-CM | POA: Diagnosis not present

## 2017-01-04 DIAGNOSIS — Z6832 Body mass index (BMI) 32.0-32.9, adult: Secondary | ICD-10-CM | POA: Diagnosis not present

## 2017-01-04 DIAGNOSIS — N8189 Other female genital prolapse: Secondary | ICD-10-CM

## 2017-01-04 DIAGNOSIS — E6609 Other obesity due to excess calories: Secondary | ICD-10-CM

## 2017-01-04 NOTE — Patient Instructions (Signed)
1. Encounter for gynecological examination (general) (routine) with abnormal findings Gyn exam with mild atrophic vaginitis and relaxation of vagina.  Pap done.  Breasts wnl.  Schedule screening mammo every year.  2. H/O abdominal hysterectomy   3. Menopause present No HRT.  Vit D supplement to continue.  Ca++ in nutrition.  Weight bearing physical activity. - DG Bone Density; Future  4. Vaginal relaxation No Colpocele, Cystocele or Rectocele.  Recommend Kegel exercises for whole pelvic floor and the weight balls in vagina to work especially on vaginal relaxation.  5. Post-menopausal atrophic vaginitis Controled with Replens moisturizer and lubricants.  Declines Vaginal Estrogen treatment at this time.  6. Class 1 obesity due to excess calories without serious comorbidity with body mass index (BMI) of 32.0 to 32.9 in adult Low calorie/low carb diet and regular physical activity recommended.  [employer] discussed.  Faith Delgado, it was a pleasure to meet you today!  I will inform you of your results when available.     Kegel Exercises Kegel exercises help strengthen the muscles that support the rectum, vagina, small intestine, bladder, and uterus. Doing Kegel exercises can help:  Improve bladder and bowel control.  Improve sexual response.  Reduce problems and discomfort during pregnancy.  Kegel exercises involve squeezing your pelvic floor muscles, which are the same muscles you squeeze when you try to stop the flow of urine. The exercises can be done while sitting, standing, or lying down, but it is best to vary your position. Phase 1 exercises 1. Squeeze your pelvic floor muscles tight. You should feel a tight lift in your rectal area. If you are a female, you should also feel a tightness in your vaginal area. Keep your stomach, buttocks, and legs relaxed. 2. Hold the muscles tight for up to 10 seconds. 3. Relax your muscles. Repeat this exercise 50 times a day or as many times  as told by your health care provider. Continue to do this exercise for at least 4-6 weeks or for as long as told by your health care provider. This information is not intended to replace advice given to you by your health care provider. Make sure you discuss any questions you have with your health care provider. Document Released: 04/18/2012 Document Revised: 12/26/2015 Document Reviewed: 03/22/2015 Elsevier Interactive Patient Education  2018 [place] for [employer] Calories are units of energy. Your body needs a certain amount of calories from food to keep you going throughout the day. When you eat more calories than your body needs, your body stores the extra calories as fat. When you eat fewer calories than your body needs, your body burns fat to get the energy it needs. Calorie counting means keeping track of how many calories you eat and drink each day. Calorie counting can be helpful if you need to lose weight. If you make sure to eat fewer calories than your body needs, you should lose weight. Ask your health care provider what a healthy weight is for you. For calorie counting to work, you will need to eat the right number of calories in a day in order to lose a healthy amount of weight per week. A dietitian can help you determine how many calories you need in a day and will give you suggestions on how to reach your calorie goal.  A healthy amount of weight to lose per week is usually 1-2 lb (0.5-0.9 kg). This usually means that your daily calorie intake should be reduced by  500-750 calories.  Eating 1,200 - 1,500 calories per day can help most women lose weight.  Eating 1,500 - 1,800 calories per day can help most men lose weight.  What is my plan? My goal is to have __________ calories per day. If I have this many calories per day, I should lose around __________ pounds per week. What do I need to know about calorie counting? In order to meet your daily calorie  goal, you will need to:  Find out how many calories are in each food you would like to eat. Try to do this before you eat.  Decide how much of the food you plan to eat.  Write down what you ate and how many calories it had. Doing this is called keeping a food log.  To successfully lose weight, it is important to balance calorie counting with a healthy lifestyle that includes regular activity. Aim for 150 minutes of moderate exercise (such as walking) or 75 minutes of vigorous exercise (such as running) each week. Where do I find calorie information?  The number of calories in a food can be found on a Nutrition Facts label. If a food does not have a Nutrition Facts label, try to look up the calories online or ask your dietitian for help. Remember that calories are listed per serving. If you choose to have more than one serving of a food, you will have to multiply the calories per serving by the amount of servings you plan to eat. For example, the label on a package of bread might say that a serving size is 1 slice and that there are 90 calories in a serving. If you eat 1 slice, you will have eaten 90 calories. If you eat 2 slices, you will have eaten 180 calories. How do I keep a food log? Immediately after each meal, record the following information in your food log:  What you ate. Don't forget to include toppings, sauces, and other extras on the food.  How much you ate. This can be measured in cups, ounces, or number of items.  How many calories each food and drink had.  The total number of calories in the meal.  Keep your food log near you, such as in a small notebook in your pocket, or use a mobile app or website. Some programs will calculate calories for you and show you how many calories you have left for the day to meet your goal. What are some calorie counting tips?  Use your calories on foods and drinks that will fill you up and not leave you hungry: ? Some examples of foods that  fill you up are nuts and nut butters, vegetables, lean proteins, and high-fiber foods like whole grains. High-fiber foods are foods with more than 5 g fiber per serving. ? Drinks such as sodas, specialty coffee drinks, alcohol, and juices have a lot of calories, yet do not fill you up.  Eat nutritious foods and avoid empty calories. Empty calories are calories you get from foods or beverages that do not have many vitamins or protein, such as candy, sweets, and soda. It is better to have a nutritious high-calorie food (such as an avocado) than a food with few nutrients (such as a bag of chips).  Know how many calories are in the foods you eat most often. This will help you calculate calorie counts faster.  Pay attention to calories in drinks. Low-calorie drinks include water and unsweetened drinks.  Pay attention to  nutrition labels for "low fat" or "fat free" foods. These foods sometimes have the same amount of calories or more calories than the full fat versions. They also often have added sugar, starch, or salt, to make up for flavor that was removed with the fat.  Find a way of tracking calories that works for you. Get creative. Try different apps or programs if writing down calories does not work for you. What are some portion control tips?  Know how many calories are in a serving. This will help you know how many servings of a certain food you can have.  Use a measuring cup to measure serving sizes. You could also try weighing out portions on a kitchen scale. With time, you will be able to estimate serving sizes for some foods.  Take some time to put servings of different foods on your favorite plates, bowls, and cups so you know what a serving looks like.  Try not to eat straight from a bag or box. Doing this can lead to overeating. Put the amount you would like to eat in a cup or on a plate to make sure you are eating the right portion.  Use smaller plates, glasses, and bowls to prevent  overeating.  Try not to multitask (for example, watch TV or use your computer) while eating. If it is time to eat, sit down at a table and enjoy your food. This will help you to know when you are full. It will also help you to be aware of what you are eating and how much you are eating. What are tips for following this plan? Reading food labels  Check the calorie count compared to the serving size. The serving size may be smaller than what you are used to eating.  Check the source of the calories. Make sure the food you are eating is high in vitamins and protein and low in saturated and trans fats. Shopping  Read nutrition labels while you shop. This will help you make healthy decisions before you decide to purchase your food.  Make a grocery list and stick to it. Cooking  Try to cook your favorite foods in a healthier way. For example, try baking instead of frying.  Use low-fat dairy products. Meal planning  Use more fruits and vegetables. Half of your plate should be fruits and vegetables.  Include lean proteins like poultry and fish. How do I count calories when eating out?  Ask for smaller portion sizes.  Consider sharing an entree and sides instead of getting your own entree.  If you get your own entree, eat only half. Ask for a box at the beginning of your meal and put the rest of your entree in it so you are not tempted to eat it.  If calories are listed on the menu, choose the lower calorie options.  Choose dishes that include vegetables, fruits, whole grains, low-fat dairy products, and lean protein.  Choose items that are boiled, broiled, grilled, or steamed. Stay away from items that are buttered, battered, fried, or served with cream sauce. Items labeled "crispy" are usually fried, unless stated otherwise.  Choose water, low-fat milk, unsweetened iced tea, or other drinks without added sugar. If you want an alcoholic beverage, choose a lower calorie option such as a  glass of wine or light beer.  Ask for dressings, sauces, and syrups on the side. These are usually high in calories, so you should limit the amount you eat.  If you want  a salad, choose a garden salad and ask for grilled meats. Avoid extra toppings like bacon, cheese, or fried items. Ask for the dressing on the side, or ask for olive oil and vinegar or lemon to use as dressing.  Estimate how many servings of a food you are given. For example, a serving of cooked rice is  cup or about the size of half a baseball. Knowing serving sizes will help you be aware of how much food you are eating at restaurants. The list below tells you how big or small some common portion sizes are based on everyday objects: ? 1 oz-4 stacked dice. ? 3 oz-1 deck of cards. ? 1 tsp-1 die. ? 1 Tbsp- a ping-pong ball. ? 2 Tbsp-1 ping-pong ball. ?  cup- baseball. ? 1 cup-1 baseball. Summary  Calorie counting means keeping track of how many calories you eat and drink each day. If you eat fewer calories than your body needs, you should lose weight.  A healthy amount of weight to lose per week is usually 1-2 lb (0.5-0.9 kg). This usually means reducing your daily calorie intake by 500-750 calories.  The number of calories in a food can be found on a Nutrition Facts label. If a food does not have a Nutrition Facts label, try to look up the calories online or ask your dietitian for help.  Use your calories on foods and drinks that will fill you up, and not on foods and drinks that will leave you hungry.  Use smaller plates, glasses, and bowls to prevent overeating. This information is not intended to replace advice given to you by your health care provider. Make sure you discuss any questions you have with your health care provider. Document Released: 05/02/2005 Document Revised: 04/01/2016 Document Reviewed: 04/01/2016 Elsevier Interactive Patient Education  2017 [place] Maintenance for Postmenopausal  Women Menopause is a normal process in which your reproductive ability comes to an end. This process happens gradually over a span of months to years, usually between the ages of 63 and 49. Menopause is complete when you have missed 12 consecutive menstrual periods. It is important to talk with your health care provider about some of the most common conditions that affect postmenopausal women, such as heart disease, cancer, and bone loss (osteoporosis). Adopting a healthy lifestyle and getting preventive care can help to promote your health and wellness. Those actions can also lower your chances of developing some of these common conditions. What should I know about menopause? During menopause, you may experience a number of symptoms, such as:  Moderate-to-severe hot flashes.  Night sweats.  Decrease in sex drive.  Mood swings.  Headaches.  Tiredness.  Irritability.  Memory problems.  Insomnia.  Choosing to treat or not to treat menopausal changes is an individual decision that you make with your health care provider. What should I know about hormone replacement therapy and supplements? Hormone therapy products are effective for treating symptoms that are associated with menopause, such as hot flashes and night sweats. Hormone replacement carries certain risks, especially as you become older. If you are thinking about using estrogen or estrogen with progestin treatments, discuss the benefits and risks with your health care provider. What should I know about heart disease and stroke? Heart disease, heart attack, and stroke become more likely as you age. This may be due, in part, to the hormonal changes that your body experiences during menopause. These can affect how your body processes dietary fats, triglycerides, and  cholesterol. Heart attack and stroke are both medical emergencies. There are many things that you can do to help prevent heart disease and stroke:  Have your blood  pressure checked at least every 1-2 years. High blood pressure causes heart disease and increases the risk of stroke.  If you are 66-73 years old, ask your health care provider if you should take aspirin to prevent a heart attack or a stroke.  Do not use any tobacco products, including cigarettes, chewing tobacco, or electronic cigarettes. If you need help quitting, ask your health care provider.  It is important to eat a healthy diet and maintain a healthy weight. ? Be sure to include plenty of vegetables, fruits, low-fat dairy products, and lean protein. ? Avoid eating foods that are high in solid fats, added sugars, or salt (sodium).  Get regular exercise. This is one of the most important things that you can do for your health. ? Try to exercise for at least 150 minutes each week. The type of exercise that you do should increase your heart rate and make you sweat. This is known as moderate-intensity exercise. ? Try to do strengthening exercises at least twice each week. Do these in addition to the moderate-intensity exercise.  Know your numbers.Ask your health care provider to check your cholesterol and your blood glucose. Continue to have your blood tested as directed by your health care provider.  What should I know about cancer screening? There are several types of cancer. Take the following steps to reduce your risk and to catch any cancer development as early as possible. Breast Cancer  Practice breast self-awareness. ? This means understanding how your breasts normally appear and feel. ? It also means doing regular breast self-exams. Let your health care provider know about any changes, no matter how small.  If you are 31 or older, have a clinician do a breast exam (clinical breast exam or CBE) every year. Depending on your age, family history, and medical history, it may be recommended that you also have a yearly breast X-ray (mammogram).  If you have a family history of breast  cancer, talk with your health care provider about genetic screening.  If you are at high risk for breast cancer, talk with your health care provider about having an MRI and a mammogram every year.  Breast cancer (BRCA) gene test is recommended for women who have family members with BRCA-related cancers. Results of the assessment will determine the need for genetic counseling and BRCA1 and for BRCA2 testing. BRCA-related cancers include these types: ? Breast. This occurs in males or females. ? Ovarian. ? Tubal. This may also be called fallopian tube cancer. ? Cancer of the abdominal or pelvic lining (peritoneal cancer). ? Prostate. ? Pancreatic.  Cervical, Uterine, and Ovarian Cancer Your health care provider may recommend that you be screened regularly for cancer of the pelvic organs. These include your ovaries, uterus, and vagina. This screening involves a pelvic exam, which includes checking for microscopic changes to the surface of your cervix (Pap test).  For women ages 21-65, health care providers may recommend a pelvic exam and a Pap test every three years. For women ages 71-65, they may recommend the Pap test and pelvic exam, combined with testing for human papilloma virus (HPV), every five years. Some types of HPV increase your risk of cervical cancer. Testing for HPV may also be done on women of any age who have unclear Pap test results.  Other health care providers may  not recommend any screening for nonpregnant women who are considered low risk for pelvic cancer and have no symptoms. Ask your health care provider if a screening pelvic exam is right for you.  If you have had past treatment for cervical cancer or a condition that could lead to cancer, you need Pap tests and screening for cancer for at least 20 years after your treatment. If Pap tests have been discontinued for you, your risk factors (such as having a new sexual partner) need to be reassessed to determine if you should  start having screenings again. Some women have medical problems that increase the chance of getting cervical cancer. In these cases, your health care provider may recommend that you have screening and Pap tests more often.  If you have a family history of uterine cancer or ovarian cancer, talk with your health care provider about genetic screening.  If you have vaginal bleeding after reaching menopause, tell your health care provider.  There are currently no reliable tests available to screen for ovarian cancer.  Lung Cancer Lung cancer screening is recommended for adults 5-65 years old who are at high risk for lung cancer because of a history of smoking. A yearly low-dose CT scan of the lungs is recommended if you:  Currently smoke.  Have a history of at least 30 pack-years of smoking and you currently smoke or have quit within the past 15 years. A pack-year is smoking an average of one pack of cigarettes per day for one year.  Yearly screening should:  Continue until it has been 15 years since you quit.  Stop if you develop a health problem that would prevent you from having lung cancer treatment.  Colorectal Cancer  This type of cancer can be detected and can often be prevented.  Routine colorectal cancer screening usually begins at age 83 and continues through age 58.  If you have risk factors for colon cancer, your health care provider may recommend that you be screened at an earlier age.  If you have a family history of colorectal cancer, talk with your health care provider about genetic screening.  Your health care provider may also recommend using home test kits to check for hidden blood in your stool.  A small camera at the end of a tube can be used to examine your colon directly (sigmoidoscopy or colonoscopy). This is done to check for the earliest forms of colorectal cancer.  Direct examination of the colon should be repeated every 5-10 years until age 85. However, if  early forms of precancerous polyps or small growths are found or if you have a family history or genetic risk for colorectal cancer, you may need to be screened more often.  Skin Cancer  Check your skin from head to toe regularly.  Monitor any moles. Be sure to tell your health care provider: ? About any new moles or changes in moles, especially if there is a change in a mole's shape or color. ? If you have a mole that is larger than the size of a pencil eraser.  If any of your family members has a history of skin cancer, especially at a young age, talk with your health care provider about genetic screening.  Always use sunscreen. Apply sunscreen liberally and repeatedly throughout the day.  Whenever you are outside, protect yourself by wearing long sleeves, pants, a wide-brimmed hat, and sunglasses.  What should I know about osteoporosis? Osteoporosis is a condition in which bone destruction happens  more quickly than new bone creation. After menopause, you may be at an increased risk for osteoporosis. To help prevent osteoporosis or the bone fractures that can happen because of osteoporosis, the following is recommended:  If you are 57-30 years old, get at least 1,000 mg of calcium and at least 600 mg of vitamin D per day.  If you are older than age 26 but younger than age 80, get at least 1,200 mg of calcium and at least 600 mg of vitamin D per day.  If you are older than age 65, get at least 1,200 mg of calcium and at least 800 mg of vitamin D per day.  Smoking and excessive alcohol intake increase the risk of osteoporosis. Eat foods that are rich in calcium and vitamin D, and do weight-bearing exercises several times each week as directed by your health care provider. What should I know about how menopause affects my mental health? Depression may occur at any age, but it is more common as you become older. Common symptoms of depression include:  Low or sad mood.  Changes in sleep  patterns.  Changes in appetite or eating patterns.  Feeling an overall lack of motivation or enjoyment of activities that you previously enjoyed.  Frequent crying spells.  Talk with your health care provider if you think that you are experiencing depression. What should I know about immunizations? It is important that you get and maintain your immunizations. These include:  Tetanus, diphtheria, and pertussis (Tdap) booster vaccine.  Influenza every year before the flu season begins.  Pneumonia vaccine.  Shingles vaccine.  Your health care provider may also recommend other immunizations. This information is not intended to replace advice given to you by your health care provider. Make sure you discuss any questions you have with your health care provider. Document Released: 06/24/2005 Document Revised: 11/20/2015 Document Reviewed: 02/03/2015 Elsevier Interactive Patient Education  2018 [employer].

## 2017-01-04 NOTE — Addendum Note (Signed)
Addended by: Thurnell Garbe A on: 01/04/2017 11:45 AM   Modules accepted: Orders

## 2017-01-04 NOTE — Progress Notes (Signed)
Faith Delgado [birth year] [id number]   History:    56 y.o. G2P2 married  RP:  Established patient presenting for annual gyn exam   HPI:  S/P TAH for Fibroids.  Menopause.  No HRT.  C/O Vaginal dryness, didn't start Estrogen cream because of worries about risks of Estrogen therapy.  But helped with Replens moisturizer and well with lubricants for IC.  C/O "loosening" of vagina, no feeling as much with IC.  No pelvic pain.  Breasts wnl.  Mictions normal, no SUI/BMs wnl.  Past medical history,surgical history, family history and social history were all reviewed and documented in the EPIC chart.  Gynecologic History No LMP recorded. Patient has had a hysterectomy. Contraception: status post hysterectomy Last Pap: 2012. Results were: normal Last mammogram: 2017. Results were: normal Due for Bone density Colono normal 2013  Obstetric History OB History  Gravida Para Term Preterm AB Living  2 2 2     2   SAB TAB Ectopic Multiple Live Births          2    # Outcome Date GA Lbr Len/2nd Weight Sex Delivery Anes PTL Lv  2 Term     M Vag-Spont  N LIV  1 Term     M Vag-Spont  N LIV       ROS: A ROS was performed and pertinent positives and negatives are included in the history.  GENERAL: No fevers or chills. HEENT: No change in vision, no earache, sore throat or sinus congestion. NECK: No pain or stiffness. CARDIOVASCULAR: No chest pain or pressure. No palpitations. PULMONARY: No shortness of breath, cough or wheeze. GASTROINTESTINAL: No abdominal pain, nausea, vomiting or diarrhea, melena or bright red blood per rectum. GENITOURINARY: No urinary frequency, urgency, hesitancy or dysuria. MUSCULOSKELETAL: No joint or muscle pain, no back pain, no recent trauma. DERMATOLOGIC: No rash, no itching, no lesions. ENDOCRINE: No polyuria, polydipsia, no heat or cold intolerance. No recent change in weight. HEMATOLOGICAL: No anemia or easy bruising or bleeding. NEUROLOGIC: No headache, seizures,  numbness, tingling or weakness. PSYCHIATRIC: No depression, no loss of interest in normal activity or change in sleep pattern.     Exam:   BP 136/80   Ht 5' 4.5" (1.638 m)   Wt 194 lb (88 kg)   BMI 32.79 kg/m   Body mass index is 32.79 kg/m.  General appearance : Well developed well nourished female. No acute distress HEENT: Eyes: no retinal hemorrhage or exudates,  Neck supple, trachea midline, no carotid bruits, no thyroidmegaly Lungs: Clear to auscultation, no rhonchi or wheezes, or rib retractions  Heart: Regular rate and rhythm, no murmurs or gallops Breast:Examined in sitting and supine position were symmetrical in appearance, no palpable masses or tenderness,  no skin retraction, no nipple inversion, no nipple discharge, no skin discoloration, no axillary or supraclavicular lymphadenopathy Abdomen: no palpable masses or tenderness, no rebound or guarding Extremities: no edema or skin discoloration or tenderness  Pelvic: Vulva normal  Bartholin, Urethra, Skene Glands: Within normal limits             Vagina: No gross lesions or discharge.  Pap reflex done.  No significant Cystocele, Rectocele or Colpocele.  Cervix/Uterus absent.  Adnexa  Without masses or tenderness  Anus and perineum  normal    Assessment/Plan:  56 y.o. female for annual exam   1. Encounter for gynecological examination (general) (routine) with abnormal findings Gyn exam with mild atrophic vaginitis and relaxation of vagina.  Pap done.  Breasts wnl.  Schedule screening mammo every year.  2. H/O abdominal hysterectomy   3. Menopause present No HRT.  Vit D supplement to continue.  Ca++ in nutrition.  Weight bearing physical activity. - DG Bone Density; Future  4. Vaginal relaxation No Colpocele, Cystocele or Rectocele.  Recommend Kegel exercises for whole pelvic floor and the weight balls in vagina to work especially on vaginal relaxation.  5. Post-menopausal atrophic vaginitis Controled with  Replens moisturizer and lubricants.  Declines Vaginal Estrogen treatment at this time.  6. Class 1 obesity due to excess calories without serious comorbidity with body mass index (BMI) of 32.0 to 32.9 in adult Low calorie/low carb diet and regular physical activity recommended.  [employer] discussed.  Counseling on above issues >50% x 10 minutes.  Princess Bruins MD, 10:45 AM 01/04/2017

## 2017-01-05 LAB — PAP IG W/ RFLX HPV ASCU

## 2017-01-11 ENCOUNTER — Other Ambulatory Visit: Payer: Self-pay | Admitting: Obstetrics & Gynecology

## 2017-01-11 DIAGNOSIS — Z1231 Encounter for screening mammogram for malignant neoplasm of breast: Secondary | ICD-10-CM

## 2017-01-26 ENCOUNTER — Ambulatory Visit
Admission: RE | Admit: 2017-01-26 | Discharge: 2017-01-26 | Disposition: A | Discharge status: Home / Self Care | Payer: BLUE CROSS/BLUE SHIELD | Source: Ambulatory Visit | Attending: Obstetrics & Gynecology | Admitting: Obstetrics & Gynecology

## 2017-01-26 DIAGNOSIS — Z1231 Encounter for screening mammogram for malignant neoplasm of breast: Secondary | ICD-10-CM

## 2017-01-30 ENCOUNTER — Other Ambulatory Visit: Payer: Self-pay | Admitting: Obstetrics & Gynecology

## 2017-01-30 DIAGNOSIS — R928 Other abnormal and inconclusive findings on diagnostic imaging of breast: Secondary | ICD-10-CM

## 2017-02-03 ENCOUNTER — Ambulatory Visit: Admission: RE | Admit: 2017-02-03 | Payer: BLUE CROSS/BLUE SHIELD | Source: Ambulatory Visit

## 2017-02-03 ENCOUNTER — Ambulatory Visit
Admission: RE | Admit: 2017-02-03 | Discharge: 2017-02-03 | Disposition: A | Discharge status: Home / Self Care | Payer: BLUE CROSS/BLUE SHIELD | Source: Ambulatory Visit | Attending: Obstetrics & Gynecology | Admitting: Obstetrics & Gynecology

## 2017-02-03 DIAGNOSIS — R928 Other abnormal and inconclusive findings on diagnostic imaging of breast: Secondary | ICD-10-CM

## 2017-03-24 IMAGING — MG 2D DIGITAL SCREENING BILATERAL MAMMOGRAM WITH CAD AND ADJUNCT TO
8 of 15 series · 8 of 31 positions shown · non-contrast
Comparison: Previous exam(s).

CLINICAL DATA: Screening. History of benign stereotactic biopsy in
0000.

EXAM:
2D DIGITAL SCREENING BILATERAL MAMMOGRAM WITH CAD AND ADJUNCT TOMO

[L CC (1 of 2)]
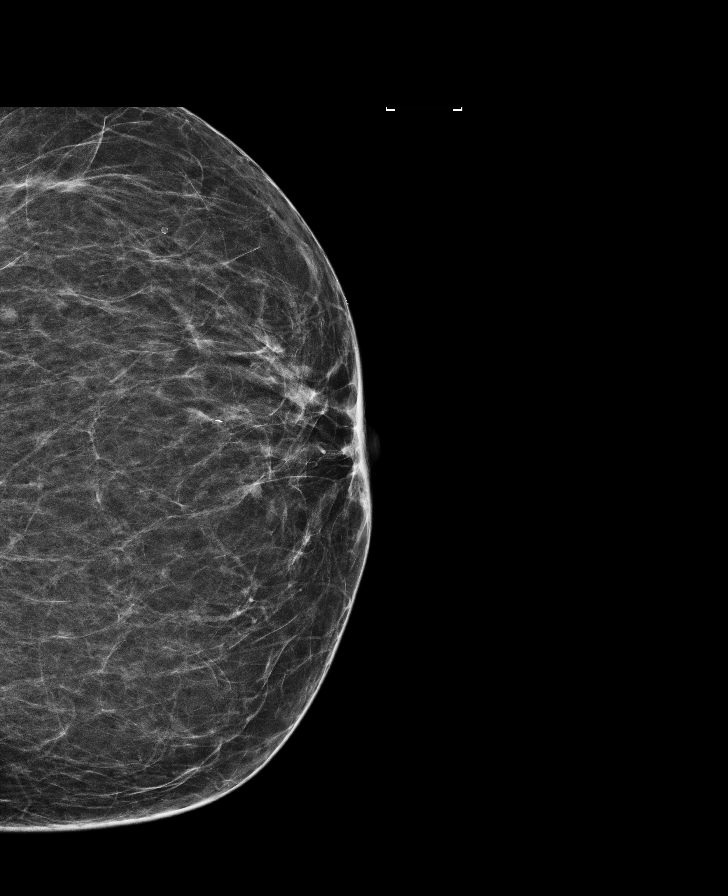

[L MLO (1 of 2)]
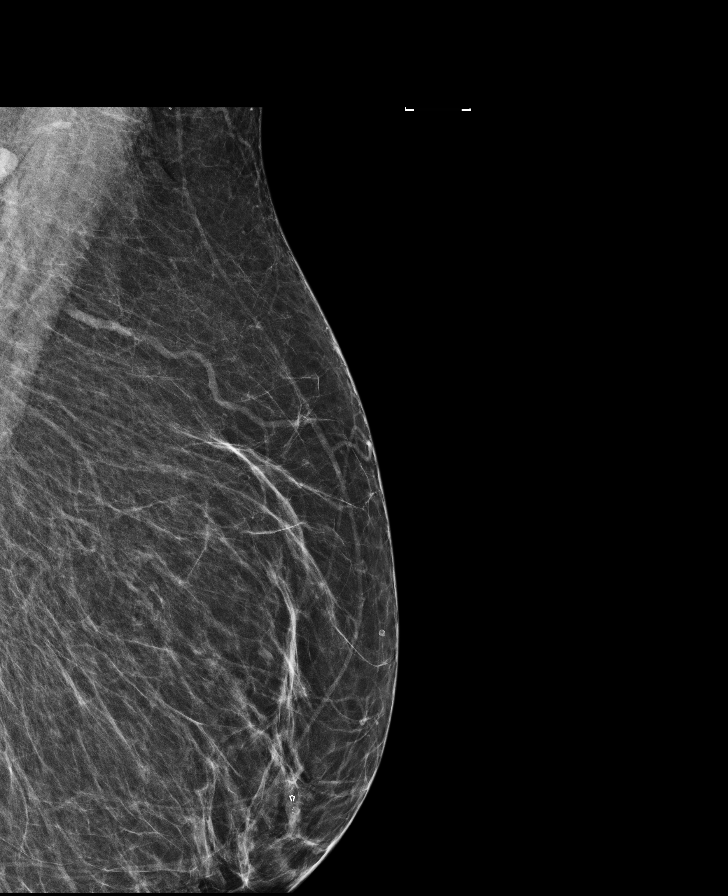

[R MLO (1 of 2)]
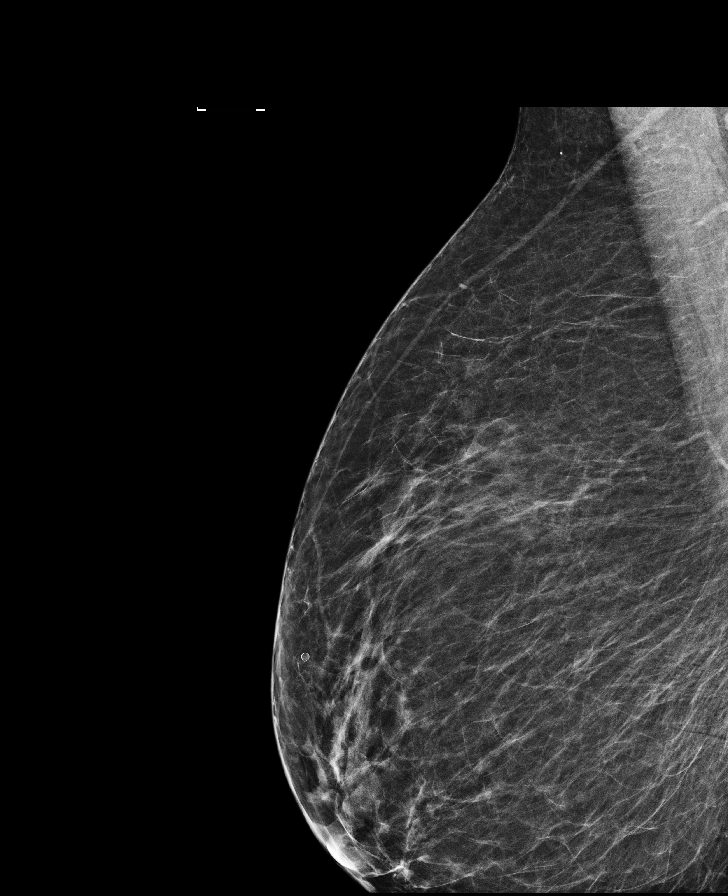

[L CC (2 of 2)]
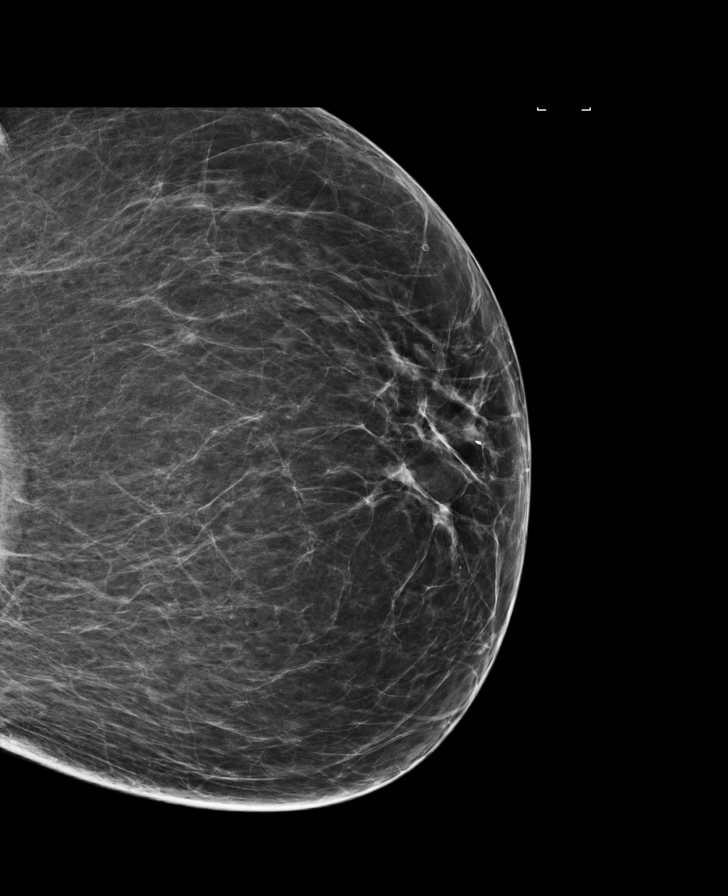

[R MLO synth-2D]
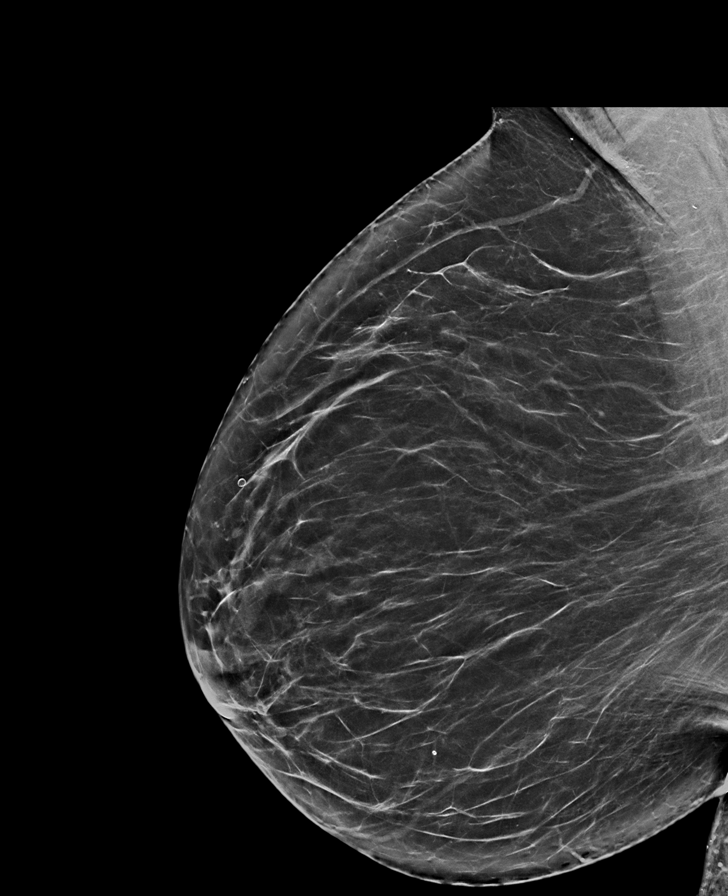

[L MLO (2 of 2)]
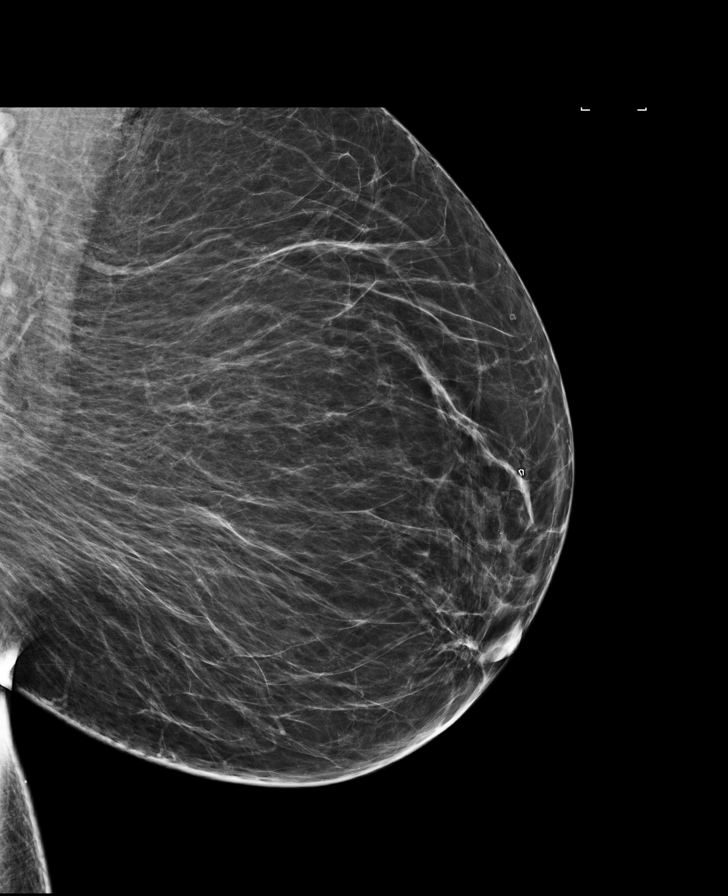

[R CC]
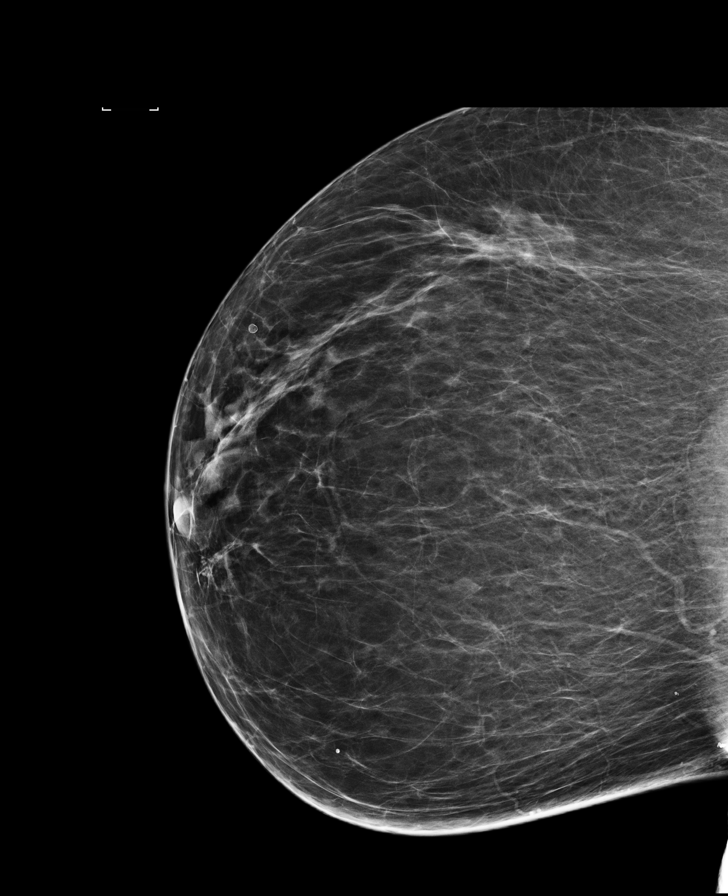

[R MLO (2 of 2)]
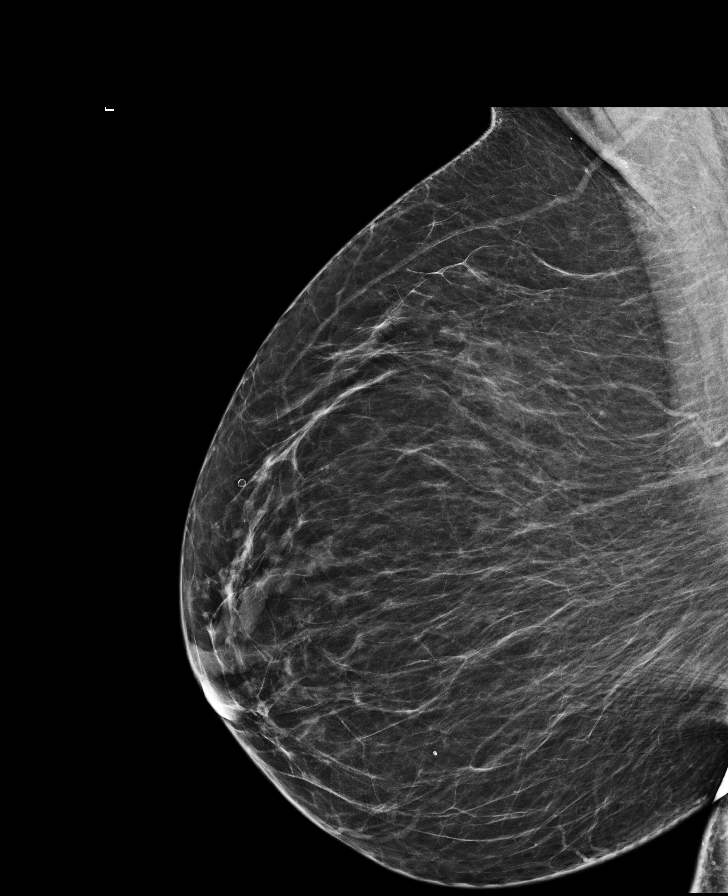

[8 of 31 positions shown; findings below may reference images not displayed]

ACR Breast Density Category b: There are scattered areas of
fibroglandular density.
FINDINGS: Biopsy site marker within the left breast is stable in position.
There are no findings suspicious for malignancy within either
breast. Images were processed with CAD.
IMPRESSION: No mammographic evidence of malignancy. A result letter of this
screening mammogram will be mailed directly to the patient.

RECOMMENDATION:
Screening mammogram in one year. (Code:67-2-43F)

BI-RADS CATEGORY  2: Benign.

## 2017-11-10 ENCOUNTER — Other Ambulatory Visit: Payer: Self-pay

## 2018-01-25 ENCOUNTER — Other Ambulatory Visit: Payer: Self-pay | Admitting: Obstetrics & Gynecology

## 2018-01-25 DIAGNOSIS — Z1231 Encounter for screening mammogram for malignant neoplasm of breast: Secondary | ICD-10-CM

## 2018-02-06 ENCOUNTER — Ambulatory Visit
Admission: RE | Admit: 2018-02-06 | Discharge: 2018-02-06 | Disposition: A | Discharge status: Home / Self Care | Payer: BLUE CROSS/BLUE SHIELD | Source: Ambulatory Visit | Attending: Obstetrics & Gynecology | Admitting: Obstetrics & Gynecology

## 2018-02-06 DIAGNOSIS — Z1231 Encounter for screening mammogram for malignant neoplasm of breast: Secondary | ICD-10-CM

## 2018-02-12 ENCOUNTER — Encounter: Payer: BLUE CROSS/BLUE SHIELD | Admitting: Obstetrics & Gynecology

## 2018-02-12 DIAGNOSIS — Z0289 Encounter for other administrative examinations: Secondary | ICD-10-CM

## 2018-02-28 ENCOUNTER — Ambulatory Visit: Payer: BLUE CROSS/BLUE SHIELD | Admitting: Obstetrics & Gynecology

## 2018-02-28 ENCOUNTER — Encounter: Payer: Self-pay | Admitting: Obstetrics & Gynecology

## 2018-02-28 VITALS — BP 124/76 | Ht 64.5 in | Wt 193.0 lb

## 2018-02-28 DIAGNOSIS — Z780 Asymptomatic menopausal state: Secondary | ICD-10-CM | POA: Diagnosis not present

## 2018-02-28 DIAGNOSIS — Z13820 Encounter for screening for osteoporosis: Secondary | ICD-10-CM

## 2018-02-28 DIAGNOSIS — Z01419 Encounter for gynecological examination (general) (routine) without abnormal findings: Secondary | ICD-10-CM

## 2018-02-28 DIAGNOSIS — Z90710 Acquired absence of both cervix and uterus: Secondary | ICD-10-CM

## 2018-02-28 DIAGNOSIS — E6609 Other obesity due to excess calories: Secondary | ICD-10-CM

## 2018-02-28 DIAGNOSIS — Z6832 Body mass index (BMI) 32.0-32.9, adult: Secondary | ICD-10-CM

## 2018-02-28 NOTE — Patient Instructions (Addendum)
1. Well female exam with routine gynecological exam Gynecologic exam status post TAH and menopause.  Pap test -August 2018.  No indication to repeat this year.  Breast exam normal.  Screening mammogram was negative in September 2019.  Health labs with family physician.  2. S/P TAH (total abdominal hysterectomy)  3. Postmenopausal Well on no hormone replacement therapy.  Recommend coconut oil for intercourse.  Will continue to do Kegel exercises to improve the pelvic floor.  4. Screening for osteoporosis Schedule bone density here now.  Continue with vitamin D supplements, calcium intake of 1.5 g/day and regular weightbearing physical activity. - DG Bone Density; Future  5. Class 1 obesity due to excess calories without serious comorbidity with body mass index (BMI) of 32.0 to 32.9 in adult Recommend a lower calorie/carb diet such as [employer].  Aerobic physical activities 5 times a week and weightlifting every 2 days.   Faith Delgado, it was a pleasure seeing you today!  I will inform you of your bone density results as soon as they are available.

## 2018-02-28 NOTE — Progress Notes (Signed)
Faith Delgado 01-Sep-1960 [id number]   History:    57 y.o. G2P2L2 Married.  RP:  Established patient presenting for annual gyn exam   HPI: Status post TAH.  Menopause, well on no hormone replacement therapy.  No pelvic pain.  Vaginal dryness improved since last year with moisturizers.  Lubricants with intercourse.  Urine normal.  No stress urinary incontinence.  Doing very well with Kegel exercises.  Bowel movements normal.  Breasts normal.  Body mass index 32.62.  Exercising regularly.  Health labs with family physician.  Past medical history,surgical history, family history and social history were all reviewed and documented in the EPIC chart.  Gynecologic History No LMP recorded. Patient has had a hysterectomy. Contraception: status post hysterectomy Last Pap: 12/2016. Results were: Negative Last mammogram: 01/2018. Results were: Negative Bone Density: Will schedule here now Colonoscopy: 2013  Obstetric History OB History  Gravida Para Term Preterm AB Living  2 2 2     2   SAB TAB Ectopic Multiple Live Births          2    # Outcome Date GA Lbr Len/2nd Weight Sex Delivery Anes PTL Lv  2 Term     M Vag-Spont  N LIV  1 Term     M Vag-Spont  N LIV     ROS: A ROS was performed and pertinent positives and negatives are included in the history.  GENERAL: No fevers or chills. HEENT: No change in vision, no earache, sore throat or sinus congestion. NECK: No pain or stiffness. CARDIOVASCULAR: No chest pain or pressure. No palpitations. PULMONARY: No shortness of breath, cough or wheeze. GASTROINTESTINAL: No abdominal pain, nausea, vomiting or diarrhea, melena or bright red blood per rectum. GENITOURINARY: No urinary frequency, urgency, hesitancy or dysuria. MUSCULOSKELETAL: No joint or muscle pain, no back pain, no recent trauma. DERMATOLOGIC: No rash, no itching, no lesions. ENDOCRINE: No polyuria, polydipsia, no heat or cold intolerance. No recent change in weight. HEMATOLOGICAL: No  anemia or easy bruising or bleeding. NEUROLOGIC: No headache, seizures, numbness, tingling or weakness. PSYCHIATRIC: No depression, no loss of interest in normal activity or change in sleep pattern.     Exam:   BP 124/76   Ht 5' 4.5" (1.638 m)   Wt 193 lb (87.5 kg)   BMI 32.62 kg/m   Body mass index is 32.62 kg/m.  General appearance : Well developed well nourished female. No acute distress HEENT: Eyes: no retinal hemorrhage or exudates,  Neck supple, trachea midline, no carotid bruits, no thyroidmegaly Lungs: Clear to auscultation, no rhonchi or wheezes, or rib retractions  Heart: Regular rate and rhythm, no murmurs or gallops Breast:Examined in sitting and supine position were symmetrical in appearance, no palpable masses or tenderness,  no skin retraction, no nipple inversion, no nipple discharge, no skin discoloration, no axillary or supraclavicular lymphadenopathy Abdomen: no palpable masses or tenderness, no rebound or guarding Extremities: no edema or skin discoloration or tenderness  Pelvic: Vulva: Normal             Vagina: No gross lesions or discharge  Cervix/Uterus absent  Adnexa  Without masses or tenderness  Anus: Normal   Assessment/Plan:  57 y.o. female for annual exam   1. Well female exam with routine gynecological exam Gynecologic exam status post TAH and menopause.  Pap test -August 2018.  No indication to repeat this year.  Breast exam normal.  Screening mammogram was negative in September 2019.  Health labs with family physician.  2. S/P TAH (total abdominal hysterectomy)  3. Postmenopausal Well on no hormone replacement therapy.  Recommend coconut oil for intercourse.  Will continue to do Kegel exercises to improve the pelvic floor.  4. Screening for osteoporosis Schedule bone density here now.  Continue with vitamin D supplements, calcium intake of 1.5 g/day and regular weightbearing physical activity. - DG Bone Density; Future  5. Class 1 obesity  due to excess calories without serious comorbidity with body mass index (BMI) of 32.0 to 32.9 in adult Recommend a lower calorie/carb diet such as [employer].  Aerobic physical activities 5 times a week and weightlifting every 2 days.  Princess Bruins MD, 2:07 PM 02/28/2018

## 2018-03-20 ENCOUNTER — Ambulatory Visit (INDEPENDENT_AMBULATORY_CARE_PROVIDER_SITE_OTHER): Payer: BLUE CROSS/BLUE SHIELD

## 2018-03-20 DIAGNOSIS — Z13820 Encounter for screening for osteoporosis: Secondary | ICD-10-CM | POA: Diagnosis not present

## 2018-04-10 ENCOUNTER — Telehealth: Payer: Self-pay | Admitting: *Deleted

## 2018-04-10 MED ORDER — FLUCONAZOLE 150 MG PO TABS: 150.0000 mg | ORAL_TABLET | Freq: Every day | ORAL | 1 refills | Status: AC

## 2018-04-10 NOTE — Telephone Encounter (Signed)
Patient called c/o yeast infection itching and white discharge, asked if diflucan tablet can be sent? Please advise

## 2018-04-10 NOTE — Telephone Encounter (Signed)
Agree with Fluconazole 150 mg/tab 1 tab daily x 3.  #3, refill x 1.

## 2018-04-10 NOTE — Telephone Encounter (Signed)
Rx sent left on voicemail this was done.

## 2018-05-15 ENCOUNTER — Encounter: Payer: BLUE CROSS/BLUE SHIELD | Admitting: Obstetrics & Gynecology

## 2019-03-06 ENCOUNTER — Encounter: Payer: BLUE CROSS/BLUE SHIELD | Admitting: Obstetrics & Gynecology

## 2019-06-02 ENCOUNTER — Other Ambulatory Visit: Payer: Self-pay

## 2019-06-02 ENCOUNTER — Emergency Department (HOSPITAL_COMMUNITY)
Admission: EM | Admit: 2019-06-02 | Discharge: 2019-06-03 | Disposition: A | Discharge status: Home / Self Care | Payer: BC Managed Care – PPO | Attending: Emergency Medicine | Admitting: Emergency Medicine

## 2019-06-02 ENCOUNTER — Encounter (HOSPITAL_COMMUNITY): Payer: Self-pay | Admitting: Emergency Medicine

## 2019-06-02 ENCOUNTER — Emergency Department (HOSPITAL_COMMUNITY): Payer: BC Managed Care – PPO

## 2019-06-02 DIAGNOSIS — R1013 Epigastric pain: Secondary | ICD-10-CM | POA: Insufficient documentation

## 2019-06-02 DIAGNOSIS — I10 Essential (primary) hypertension: Secondary | ICD-10-CM | POA: Diagnosis not present

## 2019-06-02 DIAGNOSIS — Z79899 Other long term (current) drug therapy: Secondary | ICD-10-CM | POA: Diagnosis not present

## 2019-06-02 DIAGNOSIS — R1031 Right lower quadrant pain: Secondary | ICD-10-CM | POA: Insufficient documentation

## 2019-06-02 DIAGNOSIS — R030 Elevated blood-pressure reading, without diagnosis of hypertension: Secondary | ICD-10-CM

## 2019-06-02 LAB — URINALYSIS, ROUTINE W REFLEX MICROSCOPIC
Bilirubin Urine: NEGATIVE
Glucose, UA: NEGATIVE mg/dL
Hgb urine dipstick: NEGATIVE
Ketones, ur: NEGATIVE mg/dL
Leukocytes,Ua: NEGATIVE
Nitrite: NEGATIVE
Protein, ur: NEGATIVE mg/dL
Specific Gravity, Urine: 1.023 (ref 1.005–1.030)
pH: 6 (ref 5.0–8.0)

## 2019-06-02 LAB — HEPATIC FUNCTION PANEL
ALT: 31 U/L (ref 0–44)
AST: 22 U/L (ref 15–41)
Albumin: 4.5 g/dL (ref 3.5–5.0)
Alkaline Phosphatase: 92 U/L (ref 38–126)
Bilirubin, Direct: <0.1 mg/dL (ref 0.0–0.2)
Total Bilirubin: 0.4 mg/dL (ref 0.3–1.2)
Total Protein: 8.4 g/dL — ABNORMAL HIGH (ref 6.5–8.1)

## 2019-06-02 LAB — CBC
HCT: 42.9 % (ref 36.0–46.0)
Hemoglobin: 13 g/dL (ref 12.0–15.0)
MCH: 26.4 pg (ref 26.0–34.0)
MCHC: 30.3 g/dL (ref 30.0–36.0)
MCV: 87.2 fL (ref 80.0–100.0)
Platelets: 301 10*3/uL (ref 150–400)
RBC: 4.92 MIL/uL (ref 3.87–5.11)
RDW: 13.6 % (ref 11.5–15.5)
WBC: 6.6 10*3/uL (ref 4.0–10.5)
nRBC: 0 % (ref 0.0–0.2)

## 2019-06-02 LAB — BASIC METABOLIC PANEL
Anion gap: 10 (ref 5–15)
BUN: 13 mg/dL (ref 6–20)
CO2: 30 mmol/L (ref 22–32)
Calcium: 10 mg/dL (ref 8.9–10.3)
Chloride: 99 mmol/L (ref 98–111)
Creatinine, Ser: 0.63 mg/dL (ref 0.44–1.00)
GFR calc Af Amer: >60 mL/min (ref >60)
GFR calc non Af Amer: >60 mL/min (ref >60)
Glucose, Bld: 90 mg/dL (ref 70–99)
Potassium: 3.6 mmol/L (ref 3.5–5.1)
Sodium: 139 mmol/L (ref 135–145)

## 2019-06-02 LAB — LIPASE, BLOOD: Lipase: 18 U/L (ref 11–51)

## 2019-06-02 MED ORDER — ACETAMINOPHEN 325 MG PO TABS
650.0000 mg | ORAL_TABLET | Freq: Once | ORAL | Status: AC
  Administered 2019-06-02: 650 mg via ORAL
  Filled 2019-06-02: qty 2

## 2019-06-02 MED ORDER — IOHEXOL 300 MG/ML  SOLN
100.0000 mL | Freq: Once | INTRAMUSCULAR | Status: AC | PRN
  Administered 2019-06-02: 100 mL via INTRAVENOUS

## 2019-06-02 MED ORDER — SODIUM CHLORIDE (PF) 0.9 % IJ SOLN
INTRAMUSCULAR | Status: AC
  Filled 2019-06-02: qty 50

## 2019-06-02 NOTE — ED Provider Notes (Signed)
[facility] DEPT Provider Note   CSN: [id number] Arrival date & time: 06/02/19  1633     History Chief Complaint  Patient presents with   Flank Pain    Faith Delgado is a 59 y.o. female with a past medical history significant for menorrhagia, fibroids, GERD, hypertension, and obesity who presents to the ED due to gradual onset of worsening RLQ and epigastric pain x 2 days. Patient was seen at [facility] prior to ED arrival and per patient was sent here due to hematuria and elevated blood pressure. Patient explains to me that UC provider was concerned about possible gallbladder etiology or appendicitis. Patient states her pain is a 10/10 and worse with movement. Patient denies associative diarrhea, vomiting, and nausea. Patient denies dysuria, but admits to pelvic pressure with urination. She denies vaginal symptoms. She is monogamous with her husband and has no concern for STDs at this time. Patient has a history of a hysterectomy and tubal ligation, but no other abdominal operations. Patient denies changes to bowel movements. Patient denies fever and chills.     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   HTN (hypertension) 11/22/2011   GERD (gastroesophageal reflux disease) 11/22/2011    Past Surgical History:  Procedure Laterality Date   ABDOMINAL HYSTERECTOMY  11-07-2005   TAH    BREAST BIOPSY Left 1998   TUBAL LIGATION  1994     OB History    Gravida  2   Para  2   Term  2   Preterm      AB      Living  2     SAB      TAB      Ectopic      Multiple      Live Births  2           Family History    Problem Relation Age of Onset   Hypertension Mother    Cancer Mother        OVARIAN OR UTERINE CANCER   Hypertension Father    Hypertension Sister    Cancer Maternal Grandmother 90       colon   Colon cancer Maternal Grandmother 90    Social History   Tobacco Use   Smoking status: Never Smoker   Smokeless tobacco: Never Used  Substance Use Topics   Alcohol use: No    Alcohol/week: 0.0 standard drinks   Drug use: No    Home Medications Prior to Admission medications   Medication Sig Start Date End Date Taking? Authorizing Provider  Black Currant Seed Oil 500 MG CAPS Take by mouth.    [provider]  Calcium Carbonate-Vit D-Min (CALTRATE PLUS PO) Take 1 tablet by mouth 2 (two) times daily.      [provider]  cholecalciferol (VITAMIN D) 1000 UNITS tablet Take 1,000 Units by mouth 2 (two) times daily.    [provider]  fluconazole (DIFLUCAN) 150 MG tablet Take 1 tablet (150 mg total) by mouth daily. 04/10/18   Princess Bruins, MD  hydrochlorothiazide (HYDRODIURIL) 25 MG tablet TAKE 1 TABLET BY MOUTH  DAILY 10/04/16   Terrance Mass, MD  Multiple Vitamin (MULTIVITAMIN) capsule Take 1 capsule by mouth daily.      [provider]  Omega-3 Fatty Acids (FISH OIL PO) Take by mouth.      [provider]    Allergies    Patient has no known allergies.  Review of Systems   Review of Systems  Constitutional: Negative for chills and fever.  Respiratory: Negative for shortness of breath.   Cardiovascular: Negative for chest pain.  Gastrointestinal: Positive for abdominal pain. Negative for abdominal distention, diarrhea, nausea and vomiting.  Genitourinary: Positive for pelvic pain (pelvic pressure with urination). Negative for difficulty urinating, dysuria, hematuria, vaginal bleeding, vaginal discharge and vaginal pain.  Musculoskeletal: Negative for back pain.  All other systems reviewed and are negative.   Physical  Exam Updated Vital Signs BP (!) 146/88 (BP Location: Right Arm)    Pulse 92    Temp 98.8 F (37.1 C) (Oral)    Resp 18    Ht 5\' 5"  (1.651 m)    Wt 88.5 kg    SpO2 100%    BMI 32.45 kg/m   Physical Exam Vitals and nursing note reviewed.  Constitutional:      General: She is not in acute distress.    Appearance: She is not ill-appearing.     Comments: Appears uncomfortable in chair  HENT:     Head: Normocephalic.  Eyes:     Pupils: Pupils are equal, round, and reactive to light.  Cardiovascular:     Rate and Rhythm: Normal rate and regular rhythm.     Pulses: Normal pulses.     Heart sounds: Normal heart sounds. No murmur. No friction rub. No gallop.   Pulmonary:     Effort: Pulmonary effort is normal.     Breath sounds: Normal breath sounds.  Abdominal:     General: Abdomen is flat. Bowel sounds are normal. There is no distension.     Palpations: Abdomen is soft.     Tenderness: There is abdominal tenderness. There is no right CVA tenderness, left CVA tenderness, guarding or rebound.     Comments: RLQ, epigastric, and RUQ tenderness to palpation. No rebound or guarding. Negative murphy's sign. Tenderness to palpation at Mcburney's point.   Musculoskeletal:     Cervical back: Neck supple.     Comments: Able to move all 4 extremities without difficulty. No lower extremity edema.   Skin:    General: Skin is warm and dry.  Neurological:     General: No focal deficit present.     Mental Status: She is alert.  Psychiatric:        Mood and Affect: Mood normal.        Behavior: Behavior normal.     ED Results / Procedures / Treatments   Labs (all labs ordered are listed, but only abnormal results are displayed) Labs Reviewed  HEPATIC FUNCTION PANEL - Abnormal; Notable for the following components:      Result Value   Total Protein 8.4 (*)    All other components within normal limits  URINALYSIS, ROUTINE W REFLEX MICROSCOPIC  BASIC METABOLIC PANEL  CBC  LIPASE, BLOOD     EKG None  Radiology CT ABDOMEN PELVIS W CONTRAST  Result Date: 06/02/2019 CLINICAL DATA:  Right lower quadrant abdominal pain.  Hematuria. EXAM: CT ABDOMEN AND PELVIS WITH CONTRAST TECHNIQUE: Multidetector CT imaging of the abdomen and pelvis was performed using the standard protocol following bolus administration  of intravenous contrast. CONTRAST:  172mL OMNIPAQUE IOHEXOL 300 MG/ML  SOLN COMPARISON:  None. FINDINGS: Lower chest: The lung bases are clear. The heart size is normal. Hepatobiliary: The liver is normal. Normal gallbladder.There is no biliary ductal dilation. Pancreas: Normal contours without ductal dilatation. No peripancreatic fluid collection. Spleen: No splenic laceration or hematoma. Adrenals/Urinary Tract: --Adrenal glands: No adrenal hemorrhage. --Right kidney/ureter: No hydronephrosis or perinephric hematoma. --Left kidney/ureter: No hydronephrosis or perinephric hematoma. --Urinary bladder: Unremarkable. Stomach/Bowel: --Stomach/Duodenum: No hiatal hernia or other gastric abnormality. Normal duodenal course and caliber. --Small bowel: There is stasis within several small bowel loops in the right lower quadrant. No evidence for small bowel obstruction. --Colon: No focal abnormality. --Appendix: Normal. Vascular/Lymphatic: Atherosclerotic calcification is present within the non-aneurysmal abdominal aorta, without hemodynamically significant stenosis. --No retroperitoneal lymphadenopathy. --No mesenteric lymphadenopathy. --No pelvic or inguinal lymphadenopathy. Reproductive: Status post hysterectomy. No adnexal mass. Other: No ascites or free air. The abdominal wall is normal. Musculoskeletal. No acute displaced fractures. IMPRESSION: 1. No acute abnormality detected. No evidence for hydronephrosis. No radiopaque obstructing kidney stones. 2. Normal appendix in the right lower quadrant. Electronically Signed   By: Constance Holster M.D.   On: 06/02/2019 23:39     Procedures Procedures (including critical care time)  Medications Ordered in ED Medications  sodium chloride (PF) 0.9 % injection (has no administration in time range)  acetaminophen (TYLENOL) tablet 650 mg (650 mg Oral Given 06/02/19 2222)  iohexol (OMNIPAQUE) 300 MG/ML solution 100 mL (100 mLs Intravenous Contrast Given 06/02/19 2321)    ED Course  I have reviewed the triage vital signs and the nursing notes.  Pertinent labs & imaging results that were available during my care of the patient were reviewed by me and considered in my medical decision making (see chart for details).    MDM Rules/Calculators/A&P                     59 year old female presents to the ED from UC due to worsening RLQ, epigastric, and RUQ pain x 2 days. No associative nausea, vomiting, or diarrhea. Stable vitals. Patient is afebrile, not tachycardic or hypoxic. Patient in no acute distress, but appears uncomfortable in chair. RLQ, epigastric, and RUQ tenderness to palpation. No rebound or guarding. Negative murphy's sign. Tenderness to palpation at Mcburney's point. Will obtain CT abdomen given patients pain to rule out appendicitis, pancreatitis, and gallbladder etiology. Routine labs placed.  UA negative for signs of infection or hematuria. CBC unremarkable with no leukocytosis. BMP unremarkable with normal renal function and no electrolyte abnormalities. Hepatic function panel significant for mild elevated total protein at 8.4, but otherwise unremarkable. Lipase normal, doubt pancreatitis. CT scan personally reviewed which demonstrates: 1. No acute abnormality detected. No evidence for hydronephrosis. No  radiopaque obstructing kidney stones.  2. Normal appendix in the right lower quadrant.   11:55 PM reassessed patient at bedside to discuss results. Patient appears more comfortable. Abdomen soft, non-distended with continued tenderness in RLQ, epigastric region, and RUQ. Instructed patient to take over the  counter tylenol or ibuprofen as needed for pain. Patient advised to follow-up with PCP within the next week if symptoms do not improve. Patient also advised to follow-up with PCP in regards to elevated blood pressure readings. Strict ED precautions discussed with patient. Patient states understanding and agrees to plan. Patient discharged home in no acute distress and stable vitals  Final Clinical Impression(s) / ED Diagnoses Final diagnoses:  Epigastric pain  RLQ abdominal pain  Elevated blood pressure reading    Rx / DC Orders ED Discharge Orders    None       Karie Kirks 06/03/19 0003    Drenda Freeze, MD 06/05/19 [phone]

## 2019-06-02 NOTE — ED Triage Notes (Signed)
Per pt, coming from UC-states she has been having right lower quadrant pain radiating to her right flank-states UC told her she had hematuria and undiagnosed  HTN

## 2019-06-02 NOTE — Discharge Instructions (Addendum)
As discussed, all of your labs and CT scan were normal today. You may take over the counter tylenol or ibuprofen as needed for your pain. If your symptoms do not improve within the next week, follow-up with your PCP. Return to the ER for new or worsening symptoms. Also, make sure to check your blood pressure again within the next week to make sure it is not elevated since it was elevated today.

## 2019-06-02 NOTE — ED Notes (Signed)
I attempted to collect labs and was unsuccessful. 

## 2020-01-24 ENCOUNTER — Other Ambulatory Visit: Payer: BLUE CROSS/BLUE SHIELD

## 2020-01-24 ENCOUNTER — Other Ambulatory Visit: Payer: Self-pay

## 2020-01-24 DIAGNOSIS — Z20822 Contact with and (suspected) exposure to covid-19: Secondary | ICD-10-CM

## 2020-01-27 LAB — NOVEL CORONAVIRUS, NAA: SARS-CoV-2, NAA: NOT DETECTED

## 2020-04-28 ENCOUNTER — Ambulatory Visit: Payer: BLUE CROSS/BLUE SHIELD | Attending: Internal Medicine

## 2020-04-28 DIAGNOSIS — Z23 Encounter for immunization: Secondary | ICD-10-CM

## 2020-04-28 NOTE — Progress Notes (Signed)
   Covid-19 Vaccination Clinic  Name:  Faith Delgado    MRN: [id number] DOB: [birth year]  04/28/2020  [name] was observed post Covid-19 immunization for 15 minutes without incident. She was provided with Vaccine Information Sheet and instruction to access the V-Safe system.   [name] was instructed to call 911 with any severe reactions post vaccine: Marland Kitchen Difficulty breathing  . Swelling of face and throat  . A fast heartbeat  . A bad rash all over body  . Dizziness and weakness   Immunizations Administered    Name Date Dose VIS Date Route   Pfizer COVID-19 Vaccine 04/28/2020  2:44 PM 0.3 mL 03/04/2020 Intramuscular   Manufacturer: [employer]   Lot: [id number]   Geraldine: [id number]

## 2022-03-08 ENCOUNTER — Encounter: Payer: Self-pay | Admitting: Internal Medicine

## 2023-10-21 ENCOUNTER — Encounter (HOSPITAL_COMMUNITY): Payer: Self-pay

## 2023-10-21 ENCOUNTER — Emergency Department (HOSPITAL_COMMUNITY)
Admission: EM | Admit: 2023-10-21 | Discharge: 2023-10-22 | Disposition: A | Discharge status: Home / Self Care | Attending: Emergency Medicine | Admitting: Emergency Medicine

## 2023-10-21 ENCOUNTER — Other Ambulatory Visit: Payer: Self-pay

## 2023-10-21 DIAGNOSIS — N132 Hydronephrosis with renal and ureteral calculous obstruction: Secondary | ICD-10-CM | POA: Insufficient documentation

## 2023-10-21 DIAGNOSIS — Z79899 Other long term (current) drug therapy: Secondary | ICD-10-CM | POA: Diagnosis not present

## 2023-10-21 DIAGNOSIS — R109 Unspecified abdominal pain: Secondary | ICD-10-CM | POA: Diagnosis present

## 2023-10-21 DIAGNOSIS — N200 Calculus of kidney: Secondary | ICD-10-CM

## 2023-10-21 DIAGNOSIS — I10 Essential (primary) hypertension: Secondary | ICD-10-CM | POA: Insufficient documentation

## 2023-10-21 MED ORDER — ONDANSETRON 4 MG PO TBDP: 4.0000 mg | ORAL_TABLET | Freq: Once | ORAL | Status: DC | PRN

## 2023-10-21 NOTE — ED Triage Notes (Signed)
 Reports pain on the right side of her abdomen and vomiting that started this afternoon. Patient vomited more than 5 times. Emesis is clear. Denies constipation or diarrhea. Denies blood in her stool.

## 2023-10-22 ENCOUNTER — Emergency Department (HOSPITAL_COMMUNITY)

## 2023-10-22 LAB — URINALYSIS, ROUTINE W REFLEX MICROSCOPIC
Bacteria, UA: NONE SEEN
Bilirubin Urine: NEGATIVE
Glucose, UA: NEGATIVE mg/dL
Ketones, ur: 20 mg/dL — AB
Leukocytes,Ua: NEGATIVE
Nitrite: NEGATIVE
Protein, ur: NEGATIVE mg/dL
Specific Gravity, Urine: 1.015 (ref 1.005–1.030)
pH: 6 (ref 5.0–8.0)

## 2023-10-22 LAB — COMPREHENSIVE METABOLIC PANEL WITH GFR
ALT: 21 U/L (ref 0–44)
AST: 22 U/L (ref 15–41)
Albumin: 4 g/dL (ref 3.5–5.0)
Alkaline Phosphatase: 82 U/L (ref 38–126)
Anion gap: 9 (ref 5–15)
BUN: 16 mg/dL (ref 8–23)
CO2: 25 mmol/L (ref 22–32)
Calcium: 9.4 mg/dL (ref 8.9–10.3)
Chloride: 104 mmol/L (ref 98–111)
Creatinine, Ser: 0.97 mg/dL (ref 0.44–1.00)
GFR, Estimated: >60 mL/min (ref >60)
Glucose, Bld: 120 mg/dL — ABNORMAL HIGH (ref 70–99)
Potassium: 3.6 mmol/L (ref 3.5–5.1)
Sodium: 138 mmol/L (ref 135–145)
Total Bilirubin: 0.7 mg/dL (ref 0.0–1.2)
Total Protein: 7.5 g/dL (ref 6.5–8.1)

## 2023-10-22 LAB — CBC
HCT: 39 % (ref 36.0–46.0)
Hemoglobin: 12.1 g/dL (ref 12.0–15.0)
MCH: 27.3 pg (ref 26.0–34.0)
MCHC: 31 g/dL (ref 30.0–36.0)
MCV: 88 fL (ref 80.0–100.0)
Platelets: 261 10*3/uL (ref 150–400)
RBC: 4.43 MIL/uL (ref 3.87–5.11)
RDW: 13.8 % (ref 11.5–15.5)
WBC: 8 10*3/uL (ref 4.0–10.5)
nRBC: 0 % (ref 0.0–0.2)

## 2023-10-22 LAB — LIPASE, BLOOD: Lipase: 26 U/L (ref 11–51)

## 2023-10-22 MED ORDER — ONDANSETRON HCL 4 MG/2ML IJ SOLN
4.0000 mg | Freq: Once | INTRAMUSCULAR | Status: AC
  Administered 2023-10-22: 4 mg via INTRAVENOUS
  Filled 2023-10-22: qty 2

## 2023-10-22 MED ORDER — TAMSULOSIN HCL 0.4 MG PO CAPS: 0.4000 mg | ORAL_CAPSULE | Freq: Every day | ORAL | 0 refills | Status: AC

## 2023-10-22 MED ORDER — TAMSULOSIN HCL 0.4 MG PO CAPS: 0.4000 mg | ORAL_CAPSULE | Freq: Once | ORAL | Status: DC

## 2023-10-22 MED ORDER — ONDANSETRON 4 MG PO TBDP
4.0000 mg | ORAL_TABLET | Freq: Three times a day (TID) | ORAL | 0 refills | Status: AC | PRN
Start: 2023-10-22 — End: ?

## 2023-10-22 MED ORDER — LACTATED RINGERS IV BOLUS
1000.0000 mL | Freq: Once | INTRAVENOUS | Status: AC
  Administered 2023-10-22: 1000 mL via INTRAVENOUS

## 2023-10-22 MED ORDER — AMLODIPINE BESYLATE 5 MG PO TABS
5.0000 mg | ORAL_TABLET | Freq: Once | ORAL | Status: AC
  Administered 2023-10-22: 5 mg via ORAL
  Filled 2023-10-22: qty 1

## 2023-10-22 MED ORDER — IOHEXOL 300 MG/ML  SOLN
100.0000 mL | Freq: Once | INTRAMUSCULAR | Status: AC | PRN
Start: 2023-10-22 — End: 2023-10-22
  Administered 2023-10-22: 100 mL via INTRAVENOUS

## 2023-10-22 NOTE — ED Provider Notes (Signed)
 [place] [facility] AT [facility] Provider Note   CSN: [id number] Arrival date & time: 10/21/23  2316     History  Chief Complaint  Patient presents with   Abdominal Pain    Faith Delgado is a 63 y.o. female.   Abdominal Pain Associated symptoms: dysuria, nausea and vomiting   Patient presents for right-sided abdominal pain, nausea, vomiting.  Medical history includes HTN, GERD, fibroids.  She has had recent frequency, dysuria, suprapubic pressure.  She had a telehealth visit for the symptoms.  She was prescribed Macrobid.  She took her first dose and then experienced nausea, vomiting, and right-sided abdominal pain.  This was around noon yesterday.  Her pain and nausea have subsided.     Home Medications Prior to Admission medications   Medication Sig Start Date End Date Taking? Authorizing Provider  ondansetron (ZOFRAN-ODT) 4 MG disintegrating tablet Take 1 tablet (4 mg total) by mouth every 8 (eight) hours as needed for nausea or vomiting. 10/22/23  Yes Iva Mariner, MD  tamsulosin (FLOMAX) 0.4 MG CAPS capsule Take 1 capsule (0.4 mg total) by mouth daily for 5 days. 10/22/23 10/27/23 Yes Iva Mariner, MD  Black Currant Seed Oil 500 MG CAPS Take by mouth.    [provider]  Calcium Carbonate-Vit D-Min (CALTRATE PLUS PO) Take 1 tablet by mouth 2 (two) times daily.      [provider]  cholecalciferol (VITAMIN D ) 1000 UNITS tablet Take 1,000 Units by mouth 2 (two) times daily.    [provider]  fluconazole  (DIFLUCAN ) 150 MG tablet Take 1 tablet (150 mg total) by mouth daily. 04/10/18   Lavoie, Marie-Lyne, MD  hydrochlorothiazide  (HYDRODIURIL ) 25 MG tablet TAKE 1 TABLET BY MOUTH DAILY 10/04/16   Fernandez, Juan H, MD  Multiple Vitamin (MULTIVITAMIN) capsule Take 1 capsule by mouth daily.      [provider]  Omega-3 Fatty Acids (FISH OIL PO) Take by mouth.      [provider]      Allergies    Patient has no  known allergies.    Review of Systems   Review of Systems  Gastrointestinal:  Positive for abdominal pain, nausea and vomiting.  Genitourinary:  Positive for dysuria and frequency.  All other systems reviewed and are negative.   Physical Exam Updated Vital Signs BP 136/69 (BP Location: Left Arm)   Pulse 61   Temp 98.2 F (36.8 C) (Oral)   Resp 16   Ht 5\' 5"  (1.651 m)   Wt 77.1 kg   SpO2 100%   BMI 28.29 kg/m  Physical Exam Vitals and nursing note reviewed.  Constitutional:      General: She is not in acute distress.    Appearance: She is well-developed. She is not ill-appearing, toxic-appearing or diaphoretic.  HENT:     Head: Normocephalic and atraumatic.     Mouth/Throat:     Mouth: Mucous membranes are moist.  Eyes:     Extraocular Movements: Extraocular movements intact.     Conjunctiva/sclera: Conjunctivae normal.  Cardiovascular:     Rate and Rhythm: Normal rate and regular rhythm.  Pulmonary:     Effort: Pulmonary effort is normal. No respiratory distress.  Abdominal:     Palpations: Abdomen is soft.     Tenderness: There is no abdominal tenderness.  Musculoskeletal:        General: No swelling.     Cervical back: Neck supple.  Skin:    General: Skin is warm and  dry.     Coloration: Skin is not cyanotic, jaundiced or pale.  Neurological:     General: No focal deficit present.     Mental Status: She is alert and oriented to person, place, and time.  Psychiatric:        Mood and Affect: Mood normal.        Behavior: Behavior normal.     ED Results / Procedures / Treatments   Labs (all labs ordered are listed, but only abnormal results are displayed) Labs Reviewed  COMPREHENSIVE METABOLIC PANEL WITH GFR - Abnormal; Notable for the following components:      Result Value   Glucose, Bld 120 (*)    All other components within normal limits  URINALYSIS, ROUTINE W REFLEX MICROSCOPIC - Abnormal; Notable for the following components:   Hgb urine dipstick  MODERATE (*)    Ketones, ur 20 (*)    All other components within normal limits  LIPASE, BLOOD  CBC    EKG None  Radiology CT ABDOMEN PELVIS W CONTRAST Result Date: 10/22/2023 CLINICAL DATA:  Right lower quadrant abdominal pain. EXAM: CT ABDOMEN AND PELVIS WITH CONTRAST TECHNIQUE: Multidetector CT imaging of the abdomen and pelvis was performed using the standard protocol following bolus administration of intravenous contrast. RADIATION DOSE REDUCTION: This exam was performed according to the departmental dose-optimization program which includes automated exposure control, adjustment of the mA and/or kV according to patient size and/or use of iterative reconstruction technique. CONTRAST:  OMNIPAQUE  IOHEXOL  300 MG/ML  SOLN COMPARISON:  06/02/2019 FINDINGS: Lower chest: No acute abnormality. Hepatobiliary: No focal liver abnormality is seen. No gallstones, gallbladder wall thickening, or biliary dilatation. Pancreas: Unremarkable. No pancreatic ductal dilatation or surrounding inflammatory changes. Spleen: Normal in size without focal abnormality. Adrenals/Urinary Tract: Normal adrenal glands. Asymmetric right-sided nephromegaly and perinephric fat stranding with hydronephrosis. There is diffuse right hydroureter with Peri ureteral soft tissue stranding up to the level of the bladder. At the right UVJ there is a 3 mm stone, image 75/2. Normal left kidney. Stomach/Bowel: Stomach is within normal limits. Appendix appears normal. No evidence of bowel wall thickening, distention, or inflammatory changes. Vascular/Lymphatic: Aortic atherosclerosis. No enlarged abdominal or pelvic lymph nodes. Reproductive: Status post hysterectomy. No adnexal masses. Other: No abdominal wall hernia or abnormality. No abdominopelvic ascites. Musculoskeletal: Lumbar degenerative disc disease. No acute or suspicious osseous abnormality. IMPRESSION: 1. Right-sided obstructive uropathy with 3 mm stone at the right UVJ. 2.   Aortic Atherosclerosis (ICD10-I70.0). Electronically Signed   By: Kimberley Penman M.D.   On: 10/22/2023 08:30    Procedures Procedures    Medications Ordered in ED Medications  ondansetron (ZOFRAN-ODT) disintegrating tablet 4 mg (has no administration in time range)  tamsulosin (FLOMAX) capsule 0.4 mg (has no administration in time range)  lactated ringers bolus 1,000 mL (1,000 mLs Intravenous New Bag/Given 10/22/23 0654)  ondansetron (ZOFRAN) injection 4 mg (4 mg Intravenous Given 10/22/23 0653)  amLODipine (NORVASC) tablet 5 mg (5 mg Oral Given 10/22/23 0656)  iohexol  (OMNIPAQUE ) 300 MG/ML solution 100 mL (100 mLs Intravenous Contrast Given 10/22/23 0744)    ED Course/ Medical Decision Making/ A&P                                 Medical Decision Making Amount and/or Complexity of Data Reviewed Labs: ordered. Radiology: ordered.  Risk Prescription drug management.   This patient presents to the ED for concern of abdominal  pain, nausea, vomiting, this involves an extensive number of treatment options, and is a complaint that carries with it a high risk of complications and morbidity.  The differential diagnosis includes nephrolithiasis, appendicitis, colitis, bowel obstruction, constipation, UTI   Co morbidities / Chronic conditions that complicate the patient evaluation  HTN, GERD, fibroids   Additional history obtained:  Additional history obtained from EMR External records from outside source obtained and reviewed including N/A   Lab Tests:  I Ordered, and personally interpreted labs.  The pertinent results include: Normal kidney function, normal electrolytes, no leukocytosis.  No evidence of UTI but she does seem to be moderate hematuria.   Imaging Studies ordered:  I ordered imaging studies including CT of abdomen and pelvis I independently visualized and interpreted imaging which showed 3 mm right sided stone at level of UVJ, with hydroureter present. I agree with  the radiologist interpretation   Cardiac Monitoring: / EKG:  The patient was maintained on a cardiac monitor.  I personally viewed and interpreted the cardiac monitored which showed an underlying rhythm of: Sinus rhythm   Problem List / ED Course / Critical interventions / Medication management  Patient presenting for right-sided abdominal pain, nausea, vomiting.  Onset was noon yesterday.  This was preceded by some recent urinary symptoms over the past day.  On arrival in the ED, vital signs notable for hypertension.  She is prescribed HCTZ and amlodipine.  She states that she has not been taking these medications.  Home dose of amlodipine was ordered.  She currently declines any pain medication.  Will give IV fluids for replacement of fluid losses.  Zofran ordered for anticipation of p.o. challenge.  Workup was initiated.  Patient's lab work is reassuring with no leukocytosis, normal kidney function, normal electrolytes.  Her urinalysis does not show evidence of UTI.  There is some moderate blood.  CT imaging was ordered to assess for possible nephrolithiasis or appendicitis.  Results showed a 3 mm right-sided ureteral stone near the level of the UVJ.  There was some upstream hydroureter and hydronephrosis.  On reassessment, patient's pain continues to be resolved.  She has not had any further nausea.  Patient was advised to continue hydration at home and to return for any worsening of symptoms.  She was discharged in stable condition. I ordered medication including IV fluids for hydration, Zofran for nausea, amlodipine for hypertension  Social Determinants of Health:  Lives independently        Final Clinical Impression(s) / ED Diagnoses Final diagnoses:  Kidney stone    Rx / DC Orders ED Discharge Orders          Ordered    ondansetron (ZOFRAN-ODT) 4 MG disintegrating tablet  Every 8 hours PRN        10/22/23 0839    tamsulosin (FLOMAX) 0.4 MG CAPS capsule  Daily         10/22/23 0839              Iva Mariner, MD 10/22/23 [phone]

## 2023-10-22 NOTE — Discharge Instructions (Addendum)
 Your CT scan showed a 3 mm kidney stone near the level of the bladder.  You should be able to pass this.  Drink plenty of fluids at home.  A prescription for medication called ondansetron was sent to your pharmacy.  Take this as needed for nausea.  If you have any further pain or soreness, take over-the-counter ibuprofen  and Tylenol .  A prescription for a medication called tamsulosin was sent to your pharmacy.  This is to help relax the muscle cells in your urine system and help pass the stone.  Take this daily for the next few days.  Call the telephone number below to set up a follow-up appointment with a urologist.  Return to the emergency department for any worsening of symptoms.

## 2023-10-22 NOTE — ED Notes (Signed)
 Patient transported to CT
